# Patient Record
Sex: Male | Born: 1935 | Race: White | Hispanic: No | Marital: Married | Smoking: Never smoker
Health system: Southern US, Community
[De-identification: ages and names within clinical notes are randomized; demographics above are authoritative.]

## PROBLEM LIST (undated history)

## (undated) DIAGNOSIS — F0280 Dementia in other diseases classified elsewhere without behavioral disturbance: Secondary | ICD-10-CM

## (undated) DIAGNOSIS — G309 Alzheimer's disease, unspecified: Secondary | ICD-10-CM

## (undated) DIAGNOSIS — K900 Celiac disease: Secondary | ICD-10-CM

## (undated) DIAGNOSIS — C439 Malignant melanoma of skin, unspecified: Secondary | ICD-10-CM

## (undated) DIAGNOSIS — C801 Malignant (primary) neoplasm, unspecified: Secondary | ICD-10-CM

## (undated) HISTORY — DX: Alzheimer's disease, unspecified: G30.9

## (undated) HISTORY — PX: LEG SURGERY: SHX1003

## (undated) HISTORY — DX: Malignant melanoma of skin, unspecified: C43.9

## (undated) HISTORY — DX: Malignant (primary) neoplasm, unspecified: C80.1

## (undated) HISTORY — DX: Dementia in other diseases classified elsewhere without behavioral disturbance: F02.80

---

## 1998-04-18 ENCOUNTER — Emergency Department (HOSPITAL_COMMUNITY): Admission: EM | Admit: 1998-04-18 | Discharge: 1998-04-18 | Payer: Self-pay | Admitting: Emergency Medicine

## 1998-05-17 ENCOUNTER — Ambulatory Visit (HOSPITAL_COMMUNITY): Admission: RE | Admit: 1998-05-17 | Discharge: 1998-05-17 | Payer: Self-pay | Admitting: Gastroenterology

## 1998-07-26 ENCOUNTER — Ambulatory Visit (HOSPITAL_BASED_OUTPATIENT_CLINIC_OR_DEPARTMENT_OTHER): Admission: RE | Admit: 1998-07-26 | Discharge: 1998-07-26 | Payer: Self-pay | Admitting: Orthopedic Surgery

## 2003-02-18 ENCOUNTER — Emergency Department (HOSPITAL_COMMUNITY): Admission: EM | Admit: 2003-02-18 | Discharge: 2003-02-18 | Payer: Self-pay | Admitting: Emergency Medicine

## 2004-03-01 ENCOUNTER — Ambulatory Visit (HOSPITAL_COMMUNITY): Admission: RE | Admit: 2004-03-01 | Discharge: 2004-03-01 | Payer: Self-pay | Admitting: *Deleted

## 2004-03-01 ENCOUNTER — Encounter (INDEPENDENT_AMBULATORY_CARE_PROVIDER_SITE_OTHER): Payer: Self-pay | Admitting: Specialist

## 2006-11-29 ENCOUNTER — Ambulatory Visit (HOSPITAL_COMMUNITY): Admission: RE | Admit: 2006-11-29 | Discharge: 2006-11-29 | Payer: Self-pay | Admitting: Gastroenterology

## 2006-12-06 ENCOUNTER — Encounter: Admission: RE | Admit: 2006-12-06 | Discharge: 2006-12-06 | Payer: Self-pay | Admitting: Gastroenterology

## 2006-12-17 ENCOUNTER — Encounter: Admission: RE | Admit: 2006-12-17 | Discharge: 2006-12-17 | Payer: Self-pay | Admitting: Gastroenterology

## 2007-01-21 ENCOUNTER — Encounter: Admission: RE | Admit: 2007-01-21 | Discharge: 2007-01-21 | Payer: Self-pay | Admitting: Gastroenterology

## 2007-10-09 ENCOUNTER — Encounter: Admission: RE | Admit: 2007-10-09 | Discharge: 2007-10-09 | Payer: Self-pay | Admitting: General Surgery

## 2008-01-13 DIAGNOSIS — C439 Malignant melanoma of skin, unspecified: Secondary | ICD-10-CM

## 2008-01-13 HISTORY — DX: Malignant melanoma of skin, unspecified: C43.9

## 2008-03-16 HISTORY — PX: MELANOMA EXCISION: SHX5266

## 2008-03-19 ENCOUNTER — Ambulatory Visit (HOSPITAL_COMMUNITY): Admission: RE | Admit: 2008-03-19 | Discharge: 2008-03-19 | Payer: Self-pay | Admitting: General Surgery

## 2008-03-19 ENCOUNTER — Encounter (INDEPENDENT_AMBULATORY_CARE_PROVIDER_SITE_OTHER): Payer: Self-pay | Admitting: General Surgery

## 2008-04-02 ENCOUNTER — Ambulatory Visit: Payer: Self-pay | Admitting: Internal Medicine

## 2008-04-19 LAB — COMPREHENSIVE METABOLIC PANEL
ALT: 12 U/L (ref 0–53)
AST: 12 U/L (ref 0–37)
Chloride: 107 mEq/L (ref 96–112)
Creatinine, Ser: 1.12 mg/dL (ref 0.40–1.50)
Sodium: 143 mEq/L (ref 135–145)
Total Bilirubin: 0.5 mg/dL (ref 0.3–1.2)

## 2008-04-19 LAB — CBC WITH DIFFERENTIAL/PLATELET
BASO%: 0.7 % (ref 0.0–2.0)
EOS%: 3.5 % (ref 0.0–7.0)
HCT: 39.2 % (ref 38.4–49.9)
LYMPH%: 23.2 % (ref 14.0–49.0)
MCH: 29.6 pg (ref 27.2–33.4)
MCHC: 33.5 g/dL (ref 32.0–36.0)
NEUT%: 64.3 % (ref 39.0–75.0)
RBC: 4.45 10*6/uL (ref 4.20–5.82)
lymph#: 1 10*3/uL (ref 0.9–3.3)

## 2008-10-14 ENCOUNTER — Ambulatory Visit: Payer: Self-pay | Admitting: Internal Medicine

## 2008-10-19 LAB — COMPREHENSIVE METABOLIC PANEL
ALT: 9 U/L (ref 0–53)
Albumin: 3.8 g/dL (ref 3.5–5.2)
CO2: 25 mEq/L (ref 19–32)
Chloride: 108 mEq/L (ref 96–112)
Glucose, Bld: 92 mg/dL (ref 70–99)
Potassium: 4.2 mEq/L (ref 3.5–5.3)
Sodium: 144 mEq/L (ref 135–145)
Total Bilirubin: 0.5 mg/dL (ref 0.3–1.2)
Total Protein: 5.9 g/dL — ABNORMAL LOW (ref 6.0–8.3)

## 2008-10-19 LAB — CBC WITH DIFFERENTIAL/PLATELET
Basophils Absolute: 0.1 10*3/uL (ref 0.0–0.1)
Eosinophils Absolute: 0.3 10*3/uL (ref 0.0–0.5)
HGB: 12.9 g/dL — ABNORMAL LOW (ref 13.0–17.1)
MONO#: 0.4 10*3/uL (ref 0.1–0.9)
NEUT#: 3 10*3/uL (ref 1.5–6.5)
RBC: 4.43 10*6/uL (ref 4.20–5.82)
RDW: 13.7 % (ref 11.0–14.6)
WBC: 4.9 10*3/uL (ref 4.0–10.3)
lymph#: 1.2 10*3/uL (ref 0.9–3.3)
nRBC: 0 % (ref 0–0)

## 2008-10-19 LAB — LACTATE DEHYDROGENASE: LDH: 139 U/L (ref 94–250)

## 2009-03-29 ENCOUNTER — Other Ambulatory Visit: Payer: Self-pay | Admitting: Internal Medicine

## 2009-03-29 ENCOUNTER — Ambulatory Visit: Payer: Self-pay | Admitting: Internal Medicine

## 2009-03-29 LAB — COMPREHENSIVE METABOLIC PANEL
AST: 15 U/L (ref 0–37)
Albumin: 4 g/dL (ref 3.5–5.2)
Alkaline Phosphatase: 57 U/L (ref 39–117)
BUN: 19 mg/dL (ref 6–23)
Calcium: 9.1 mg/dL (ref 8.4–10.5)
Chloride: 109 mEq/L (ref 96–112)
Glucose, Bld: 94 mg/dL (ref 70–99)
Potassium: 4.1 mEq/L (ref 3.5–5.3)
Sodium: 144 mEq/L (ref 135–145)
Total Protein: 6.1 g/dL (ref 6.0–8.3)

## 2009-03-29 LAB — CBC WITH DIFFERENTIAL/PLATELET
Basophils Absolute: 0 10*3/uL (ref 0.0–0.1)
Eosinophils Absolute: 0.2 10*3/uL (ref 0.0–0.5)
HGB: 12.8 g/dL — ABNORMAL LOW (ref 13.0–17.1)
MONO%: 8.3 % (ref 0.0–14.0)
NEUT#: 3.3 10*3/uL (ref 1.5–6.5)
RBC: 4.32 10*6/uL (ref 4.20–5.82)
RDW: 14.2 % (ref 11.0–14.6)
WBC: 4.9 10*3/uL (ref 4.0–10.3)
lymph#: 1 10*3/uL (ref 0.9–3.3)

## 2009-04-14 ENCOUNTER — Ambulatory Visit (HOSPITAL_COMMUNITY): Admission: RE | Admit: 2009-04-14 | Discharge: 2009-04-14 | Payer: Self-pay | Admitting: Internal Medicine

## 2009-09-16 ENCOUNTER — Ambulatory Visit: Payer: Self-pay | Admitting: Internal Medicine

## 2009-09-20 LAB — CBC WITH DIFFERENTIAL/PLATELET
Basophils Absolute: 0 10*3/uL (ref 0.0–0.1)
EOS%: 2.9 % (ref 0.0–7.0)
Eosinophils Absolute: 0.1 10*3/uL (ref 0.0–0.5)
HCT: 35.8 % — ABNORMAL LOW (ref 38.4–49.9)
HGB: 12.4 g/dL — ABNORMAL LOW (ref 13.0–17.1)
MCH: 30.3 pg (ref 27.2–33.4)
NEUT#: 3.3 10*3/uL (ref 1.5–6.5)
NEUT%: 66 % (ref 39.0–75.0)
lymph#: 1.2 10*3/uL (ref 0.9–3.3)

## 2009-09-20 LAB — COMPREHENSIVE METABOLIC PANEL
AST: 13 U/L (ref 0–37)
Albumin: 4.3 g/dL (ref 3.5–5.2)
BUN: 16 mg/dL (ref 6–23)
CO2: 23 mEq/L (ref 19–32)
Calcium: 9 mg/dL (ref 8.4–10.5)
Chloride: 109 mEq/L (ref 96–112)
Creatinine, Ser: 1.17 mg/dL (ref 0.40–1.50)
Glucose, Bld: 124 mg/dL — ABNORMAL HIGH (ref 70–99)
Potassium: 3.7 mEq/L (ref 3.5–5.3)

## 2009-09-20 LAB — LACTATE DEHYDROGENASE: LDH: 153 U/L (ref 94–250)

## 2010-03-24 ENCOUNTER — Ambulatory Visit (HOSPITAL_COMMUNITY)
Admission: RE | Admit: 2010-03-24 | Discharge: 2010-03-24 | Disposition: A | Discharge status: Home / Self Care | Payer: MEDICARE | Source: Ambulatory Visit | Attending: Internal Medicine | Admitting: Internal Medicine

## 2010-03-24 ENCOUNTER — Other Ambulatory Visit: Payer: Self-pay | Admitting: Internal Medicine

## 2010-03-24 ENCOUNTER — Encounter (HOSPITAL_BASED_OUTPATIENT_CLINIC_OR_DEPARTMENT_OTHER): Payer: MEDICARE | Admitting: Internal Medicine

## 2010-03-24 DIAGNOSIS — C4360 Malignant melanoma of unspecified upper limb, including shoulder: Secondary | ICD-10-CM

## 2010-03-24 DIAGNOSIS — C439 Malignant melanoma of skin, unspecified: Secondary | ICD-10-CM | POA: Insufficient documentation

## 2010-03-24 DIAGNOSIS — M47814 Spondylosis without myelopathy or radiculopathy, thoracic region: Secondary | ICD-10-CM | POA: Insufficient documentation

## 2010-03-24 DIAGNOSIS — R51 Headache: Secondary | ICD-10-CM

## 2010-03-24 LAB — COMPREHENSIVE METABOLIC PANEL
ALT: 15 U/L (ref 0–53)
AST: 12 U/L (ref 0–37)
BUN: 15 mg/dL (ref 6–23)
CO2: 30 mEq/L (ref 19–32)
Creatinine, Ser: 1.17 mg/dL (ref 0.40–1.50)
Total Bilirubin: 0.5 mg/dL (ref 0.3–1.2)

## 2010-03-24 LAB — CBC WITH DIFFERENTIAL/PLATELET
BASO%: 1 % (ref 0.0–2.0)
EOS%: 5.1 % (ref 0.0–7.0)
HCT: 40.3 % (ref 38.4–49.9)
LYMPH%: 21.7 % (ref 14.0–49.0)
MCH: 29.4 pg (ref 27.2–33.4)
MCHC: 33.7 g/dL (ref 32.0–36.0)
NEUT%: 64.3 % (ref 39.0–75.0)
Platelets: 242 10*3/uL (ref 140–400)

## 2010-03-24 LAB — LACTATE DEHYDROGENASE: LDH: 137 U/L (ref 94–250)

## 2010-03-28 ENCOUNTER — Encounter (HOSPITAL_BASED_OUTPATIENT_CLINIC_OR_DEPARTMENT_OTHER): Payer: MEDICARE | Admitting: Internal Medicine

## 2010-03-28 DIAGNOSIS — C4360 Malignant melanoma of unspecified upper limb, including shoulder: Secondary | ICD-10-CM

## 2010-03-28 DIAGNOSIS — R51 Headache: Secondary | ICD-10-CM

## 2010-04-13 DIAGNOSIS — F0280 Dementia in other diseases classified elsewhere without behavioral disturbance: Secondary | ICD-10-CM | POA: Insufficient documentation

## 2010-04-13 HISTORY — DX: Dementia in other diseases classified elsewhere, unspecified severity, without behavioral disturbance, psychotic disturbance, mood disturbance, and anxiety: F02.80

## 2010-05-30 LAB — BASIC METABOLIC PANEL
CO2: 29 mEq/L (ref 19–32)
Calcium: 9.5 mg/dL (ref 8.4–10.5)
Creatinine, Ser: 1.07 mg/dL (ref 0.4–1.5)
GFR calc Af Amer: >60 mL/min (ref >60)
GFR calc non Af Amer: >60 mL/min (ref >60)
Glucose, Bld: 94 mg/dL (ref 70–99)
Sodium: 142 mEq/L (ref 135–145)

## 2010-05-30 LAB — CBC
MCHC: 32.7 g/dL (ref 30.0–36.0)
RDW: 14.1 % (ref 11.5–15.5)

## 2010-06-27 NOTE — Op Note (Signed)
NAME:  PELLEGRINO, KENNARD              ACCOUNT NO.:  [id number]   MEDICAL RECORD NO.:  [id number]          PATIENT TYPE:  AMB   LOCATION:  ENDO                         FACILITY:  [facility]   PHYSICIAN:  Shirley Friar, MDDATE OF BIRTH:  12-13-1924   DATE OF PROCEDURE:  DATE OF DISCHARGE:  11/29/2006                               OPERATIVE REPORT   PROCEDURE:  Capsule endoscopy.   ENDOSCOPIST:  Shirley Friar, MD   INDICATIONS:  Anemia, heme-positive stool, recently diagnosed celiac  disease.   PROCEDURE:  Capsule reach the stomach at 1 minute 40 seconds.  The  duodenum was reached at 37 minute 33 seconds.  The last captured image  at the end of 8 hours was still in the small intestine.   FINDINGS:  1. Scalloped mucosa in proximal small bowel consistent with celiac      disease.  2. Study limited by last image being small bowel and not colon (entire      small bowel not seen on capsule endoscopy).   RECOMMENDATION:  1. Consider small bowel enterography to look at remaining portion of      small intestine.  2. Continue gluten-free diet.  3. Check KUB to evaluate whether capsule has passed.      Shirley Friar, MD  Electronically Signed     VCS/MEDQ  D:  12/04/2006  T:  12/05/2006  Job:  [id number]   cc:   Vikki Ports, M.D.  Fax: [phone]

## 2010-06-27 NOTE — Op Note (Signed)
NAME:  Kyle York, Kyle York              ACCOUNT NO.:  [id number]   MEDICAL RECORD NO.:  [id number]          PATIENT TYPE:  AMB   LOCATION:  SDS                          FACILITY:  [facility]   PHYSICIAN:  Gabrielle Dare. Janee Morn, M.D.DATE OF BIRTH:  [birth year]   DATE OF PROCEDURE:  03/19/2008  DATE OF DISCHARGE:  03/19/2008                               OPERATIVE REPORT   PREOPERATIVE DIAGNOSIS:  Melanoma, left posterior shoulder, 0.25 mm in  thickness (T1a tumor).   POSTOPERATIVE DIAGNOSIS:  Melanoma, left posterior shoulder, 0.25 mm in  thickness (T1a tumor).   PROCEDURE:  Wide excision of melanoma, left posterior shoulder, 3 x 6 cm  with layered closure.   SURGEON:  Gabrielle Dare. Janee Morn, MD   ANESTHESIA:  General with laryngeal mask airway.   HISTORY OF PRESENT ILLNESS:  Mr. Kyle York is an 77 year old gentleman who  underwent a biopsy of his left posterior shoulder by Dr. Dorinda Hill.  The pathology report demonstrated malignant melanoma.  This is  a superficial spreading tumor, 0.25 mm in thickness.  Margin was  involved only by an in situ component.  We are planning wide excision  today.   PROCEDURE IN DETAIL:  Informed consent was obtained.  The patient was  identified, his site was marked.  He received intravenous antibiotics.  He was brought to the operating room.  General anesthesia with laryngeal  mask airway was administered.  He was placed in right lateral position.  His left posterior shoulder was prepped and draped in sterile fashion.  Marcaine 0.25% with epinephrine was injected for local anesthetic.  An  elliptical incision was then measured out primarily getting over 1 cm  margin circumferentially around the lesion, this in order to facilitate  closure.  The ellipse was 3 x 6 cm.  An incision was made.  Subcutaneous  tissues were dissected down to the underlying fascia and the ellipse of  tissue was excised completely.  The specimen was oriented for pathology  and passed  off.  I then changed my gloves and did not reuse any the  initial instruments.  The wound was copiously irrigated.  Meticulous  hemostasis was obtained.  Some flaps were raised medially and laterally.  Again, we achieved hemostasis and the wound was then closed with  subcutaneous tissues approximated with interrupted 2-0 Vicryl sutures  and the skin closed with interrupted 3-0 nylon sutures.  Sponge, needle,  and instrument counts were correct.  There was no bleeding and an  antibiotic ointment and sterile dressing were applied.  The patient  tolerated the procedure well without apparent complication and was taken  recovery room in stable condition.      Gabrielle Dare Janee Morn, M.D.  Electronically Signed     BET/MEDQ  D:  03/19/2008  T:  03/20/2008  Job:  [id number]   cc:   Dollene Cleveland, M.D.

## 2010-11-11 ENCOUNTER — Emergency Department (HOSPITAL_COMMUNITY)
Admission: EM | Admit: 2010-11-11 | Discharge: 2010-11-12 | Disposition: A | Discharge status: Home / Self Care | Payer: Medicare Other | Attending: Emergency Medicine | Admitting: Emergency Medicine

## 2010-11-11 ENCOUNTER — Emergency Department (HOSPITAL_COMMUNITY): Payer: Medicare Other

## 2010-11-11 DIAGNOSIS — G309 Alzheimer's disease, unspecified: Secondary | ICD-10-CM | POA: Insufficient documentation

## 2010-11-11 DIAGNOSIS — R634 Abnormal weight loss: Secondary | ICD-10-CM | POA: Insufficient documentation

## 2010-11-11 DIAGNOSIS — R110 Nausea: Secondary | ICD-10-CM | POA: Insufficient documentation

## 2010-11-11 DIAGNOSIS — Z79899 Other long term (current) drug therapy: Secondary | ICD-10-CM | POA: Insufficient documentation

## 2010-11-11 DIAGNOSIS — R5381 Other malaise: Secondary | ICD-10-CM | POA: Insufficient documentation

## 2010-11-11 DIAGNOSIS — F0280 Dementia in other diseases classified elsewhere without behavioral disturbance: Secondary | ICD-10-CM | POA: Insufficient documentation

## 2010-11-11 DIAGNOSIS — R0989 Other specified symptoms and signs involving the circulatory and respiratory systems: Secondary | ICD-10-CM | POA: Insufficient documentation

## 2010-11-12 ENCOUNTER — Emergency Department (HOSPITAL_COMMUNITY): Payer: Medicare Other

## 2010-11-12 LAB — COMPREHENSIVE METABOLIC PANEL
ALT: 11 U/L (ref 0–53)
AST: 13 U/L (ref 0–37)
Albumin: 4.2 g/dL (ref 3.5–5.2)
CO2: 30 mEq/L (ref 19–32)
Chloride: 103 mEq/L (ref 96–112)
Creatinine, Ser: 0.96 mg/dL (ref 0.50–1.35)
GFR calc non Af Amer: >60 mL/min (ref >60)
Potassium: 4 mEq/L (ref 3.5–5.1)
Sodium: 140 mEq/L (ref 135–145)
Total Bilirubin: 0.8 mg/dL (ref 0.3–1.2)

## 2010-11-12 LAB — DIFFERENTIAL
Eosinophils Relative: 1 % (ref 0–5)
Lymphocytes Relative: 14 % (ref 12–46)
Lymphs Abs: 1 10*3/uL (ref 0.7–4.0)
Monocytes Absolute: 0.5 10*3/uL (ref 0.1–1.0)

## 2010-11-12 LAB — POCT I-STAT TROPONIN I

## 2010-11-12 LAB — POCT I-STAT, CHEM 8
BUN: 18 mg/dL (ref 6–23)
Chloride: 105 mEq/L (ref 96–112)
Creatinine, Ser: 1.1 mg/dL (ref 0.50–1.35)
Glucose, Bld: 118 mg/dL — ABNORMAL HIGH (ref 70–99)
Potassium: 4.2 mEq/L (ref 3.5–5.1)
Sodium: 142 mEq/L (ref 135–145)

## 2010-11-12 LAB — CBC
HCT: 40.8 % (ref 39.0–52.0)
Hemoglobin: 13.5 g/dL (ref 13.0–17.0)
MCHC: 33.1 g/dL (ref 30.0–36.0)
RBC: 4.55 MIL/uL (ref 4.22–5.81)
WBC: 7.4 10*3/uL (ref 4.0–10.5)

## 2010-11-12 LAB — URINALYSIS, ROUTINE W REFLEX MICROSCOPIC
Bilirubin Urine: NEGATIVE
Glucose, UA: NEGATIVE mg/dL
Hgb urine dipstick: NEGATIVE
Protein, ur: NEGATIVE mg/dL
Urobilinogen, UA: 0.2 mg/dL (ref 0.0–1.0)

## 2010-11-12 LAB — GLUCOSE, CAPILLARY: Glucose-Capillary: 104 mg/dL — ABNORMAL HIGH (ref 70–99)

## 2010-11-13 LAB — URINE CULTURE
Colony Count: NO GROWTH
Culture: NO GROWTH

## 2011-03-03 ENCOUNTER — Telehealth: Payer: Self-pay | Admitting: Internal Medicine

## 2011-03-03 NOTE — Telephone Encounter (Signed)
spoke with pt regarding 2/12 appt,aware of 11;45 time,(conversion issue)  aom

## 2011-03-22 ENCOUNTER — Other Ambulatory Visit: Payer: Self-pay | Admitting: Internal Medicine

## 2011-03-22 ENCOUNTER — Other Ambulatory Visit (HOSPITAL_BASED_OUTPATIENT_CLINIC_OR_DEPARTMENT_OTHER): Payer: Medicare Other | Admitting: Lab

## 2011-03-22 ENCOUNTER — Ambulatory Visit (HOSPITAL_COMMUNITY)
Admission: RE | Admit: 2011-03-22 | Discharge: 2011-03-22 | Disposition: A | Discharge status: Home / Self Care | Payer: Medicare Other | Source: Ambulatory Visit | Attending: Internal Medicine | Admitting: Internal Medicine

## 2011-03-22 DIAGNOSIS — Z85820 Personal history of malignant melanoma of skin: Secondary | ICD-10-CM

## 2011-03-22 DIAGNOSIS — C4360 Malignant melanoma of unspecified upper limb, including shoulder: Secondary | ICD-10-CM

## 2011-03-22 DIAGNOSIS — R51 Headache: Secondary | ICD-10-CM

## 2011-03-22 LAB — COMPREHENSIVE METABOLIC PANEL
ALT: 12 U/L (ref 0–53)
AST: 13 U/L (ref 0–37)
Albumin: 4.2 g/dL (ref 3.5–5.2)
Alkaline Phosphatase: 58 U/L (ref 39–117)
Calcium: 9.2 mg/dL (ref 8.4–10.5)
Chloride: 108 mEq/L (ref 96–112)
Potassium: 4.5 mEq/L (ref 3.5–5.3)
Sodium: 142 mEq/L (ref 135–145)
Total Protein: 6.3 g/dL (ref 6.0–8.3)

## 2011-03-22 LAB — CBC WITH DIFFERENTIAL/PLATELET
BASO%: 0.7 % (ref 0.0–2.0)
EOS%: 4.2 % (ref 0.0–7.0)
HGB: 13.2 g/dL (ref 13.0–17.1)
MCH: 29.7 pg (ref 27.2–33.4)
MCV: 88.8 fL (ref 79.3–98.0)
MONO%: 7.8 % (ref 0.0–14.0)
NEUT#: 3.1 10*3/uL (ref 1.5–6.5)
RBC: 4.45 10*6/uL (ref 4.20–5.82)
RDW: 14.3 % (ref 11.0–14.6)
lymph#: 1 10*3/uL (ref 0.9–3.3)

## 2011-03-27 ENCOUNTER — Ambulatory Visit (HOSPITAL_BASED_OUTPATIENT_CLINIC_OR_DEPARTMENT_OTHER): Payer: Medicare Other | Admitting: Internal Medicine

## 2011-03-27 ENCOUNTER — Encounter: Payer: Self-pay | Admitting: Internal Medicine

## 2011-03-27 ENCOUNTER — Telehealth: Payer: Self-pay | Admitting: Internal Medicine

## 2011-03-27 VITALS — BP 155/70 | HR 52 | Temp 97.1°F | Ht 64.0 in | Wt 141.3 lb

## 2011-03-27 DIAGNOSIS — C439 Malignant melanoma of skin, unspecified: Secondary | ICD-10-CM

## 2011-03-27 NOTE — Telephone Encounter (Signed)
Gv pt appt for feb2014.  gv pt copy of cxr orders to have done in feb2014

## 2011-03-27 NOTE — Progress Notes (Signed)
[facility] OFFICE PROGRESS NOTE  PRINCIPAL DIAGNOSIS:  Stage IA ([id number]) malignant melanoma with Breslow thickness of 0.2 mm diagnosed in December 2009.  PRIOR THERAPY:  Status post wide excision of melanoma from the left posterior shoulder under the care of Dr Janee Morn on March 16, 2008.  CURRENT THERAPY:  Observation.  INTERVAL HISTORY: Kyle York 78 y.o. male returns to the clinic today for annual followup visit accompanied his wife. The patient was recently diagnosed with Lyme he started treatment with Namenda and Razadyne. He denied having any significant complaints today. He was last seen by his dermatologist in September of 2012. There was no suspicious lesion for melanoma. He has repeat CBC, comprehensive metabolic panel, LDH and chest x-ray performed recently and he is here today for evaluation and discussion of his lab and imaging results.  MEDICAL HISTORY: Past Medical History  Diagnosis Date  . Alzheimer disease march 2012    ALLERGIES:   has no known allergies.  MEDICATIONS:  Current Outpatient Prescriptions  Medication Sig Dispense Refill  . aspirin 81 MG tablet Take 81 mg by mouth daily.      . calcium carbonate (OS-CAL) 600 MG TABS Take 600 mg by mouth daily.      . Cyanocobalamin (VITAMIN B 12 PO) Take by mouth daily.      Marland Kitchen galantamine (RAZADYNE ER) 24 MG 24 hr capsule Take 24 mg by mouth daily with breakfast.      . memantine (NAMENDA) 10 MG tablet Take 10 mg by mouth 2 (two) times daily.        REVIEW OF SYSTEMS:  A comprehensive review of systems was negative.   PHYSICAL EXAMINATION: General appearance: alert, cooperative and no distress Neck: no adenopathy Lymph nodes: Cervical, supraclavicular, and axillary nodes normal. Resp: clear to auscultation bilaterally Cardio: regular rate and rhythm, S1, S2 normal, no murmur, click, rub or gallop GI: soft, non-tender; bowel sounds normal; no masses,  no  organomegaly Extremities: extremities normal, atraumatic, no cyanosis or edema Neurologic: Alert and oriented X 3, normal strength and tone. Normal symmetric reflexes. Normal coordination and gait Skin exam: Showed no suspicious lesion for melanoma. ECOG PERFORMANCE STATUS: 0 - Asymptomatic  Blood pressure 155/70, pulse 52, temperature 97.1 F (36.2 C), temperature source Oral, height 5\' 4"  (1.626 m), weight 141 lb 4.8 oz (64.093 kg).  LABORATORY DATA: Lab Results  Component Value Date   WBC 4.6 03/22/2011   HGB 13.2 03/22/2011   HCT 39.5 03/22/2011   MCV 88.8 03/22/2011   PLT 236 03/22/2011      Chemistry      Component Value Date/Time   NA 142 03/22/2011 0903   K 4.5 03/22/2011 0903   CL 108 03/22/2011 0903   CO2 27 03/22/2011 0903   BUN 23 03/22/2011 0903   CREATININE 1.19 03/22/2011 0903      Component Value Date/Time   CALCIUM 9.2 03/22/2011 0903   ALKPHOS 58 03/22/2011 0903   AST 13 03/22/2011 0903   ALT 12 03/22/2011 0903   BILITOT 0.5 03/22/2011 0903       RADIOGRAPHIC STUDIES: Dg Chest 2 View  03/22/2011  *RADIOLOGY REPORT*  Clinical Data: History of melanoma.  CHEST - 2 VIEW  Comparison: Plain films of the chest 11/12/2010, 04/14/2009 and 03/16/2008.  Findings: The lungs are clear.  Heart size is normal.  No pneumothorax or pleural effusion.  No focal bony abnormality with degenerative change of the thoracic spine noted.  IMPRESSION: Negative for metastatic or acute disease.  Original Report Authenticated By: Bernadene Bell. D'ALESSIO, M.D.    ASSESSMENT: This is a very pleasant 78 years old white male with history of stage IA malignant melanoma status post wide excision. He is on observation since February of 2000 and was no evidence for melanoma recurrence.  PLAN: I discussed the lab and chest x-ray results with the patient and his wife. I recommended for him continuous observation for now with repeat CBC, comprehensive metabolic panel, LDH and chest x-ray in one year. He would come back for  followup visit at that time. He was advised to keep his appointment and followup with his dermatologist.   All questions were answered. The patient knows to call the clinic with any problems, questions or concerns. We can certainly see the patient much sooner if necessary.

## 2011-05-14 ENCOUNTER — Other Ambulatory Visit: Payer: Self-pay | Admitting: Dermatology

## 2011-05-14 DIAGNOSIS — C801 Malignant (primary) neoplasm, unspecified: Secondary | ICD-10-CM

## 2011-05-14 HISTORY — PX: OTHER SURGICAL HISTORY: SHX169

## 2011-05-14 HISTORY — DX: Malignant (primary) neoplasm, unspecified: C80.1

## 2011-08-28 ENCOUNTER — Encounter: Payer: Self-pay | Admitting: *Deleted

## 2011-08-29 ENCOUNTER — Encounter: Payer: Self-pay | Admitting: Radiation Oncology

## 2011-09-03 ENCOUNTER — Encounter: Payer: Self-pay | Admitting: *Deleted

## 2011-09-03 ENCOUNTER — Ambulatory Visit: Payer: Medicare Other | Attending: Radiation Oncology | Admitting: Radiation Oncology

## 2011-09-03 ENCOUNTER — Ambulatory Visit: Payer: Medicare Other

## 2012-03-25 ENCOUNTER — Ambulatory Visit (HOSPITAL_COMMUNITY)
Admission: RE | Admit: 2012-03-25 | Discharge: 2012-03-25 | Disposition: A | Discharge status: Home / Self Care | Payer: Medicare Other | Source: Ambulatory Visit | Attending: Internal Medicine | Admitting: Internal Medicine

## 2012-03-25 ENCOUNTER — Other Ambulatory Visit: Payer: Medicare Other | Admitting: Lab

## 2012-03-25 ENCOUNTER — Other Ambulatory Visit (HOSPITAL_BASED_OUTPATIENT_CLINIC_OR_DEPARTMENT_OTHER): Payer: Medicare Other | Admitting: Lab

## 2012-03-25 DIAGNOSIS — C439 Malignant melanoma of skin, unspecified: Secondary | ICD-10-CM

## 2012-03-25 DIAGNOSIS — C4360 Malignant melanoma of unspecified upper limb, including shoulder: Secondary | ICD-10-CM

## 2012-03-25 DIAGNOSIS — I700 Atherosclerosis of aorta: Secondary | ICD-10-CM | POA: Insufficient documentation

## 2012-03-25 LAB — CBC WITH DIFFERENTIAL/PLATELET
EOS%: 6.1 % (ref 0.0–7.0)
Eosinophils Absolute: 0.3 10*3/uL (ref 0.0–0.5)
LYMPH%: 25.6 % (ref 14.0–49.0)
MCH: 29.2 pg (ref 27.2–33.4)
MCHC: 32 g/dL (ref 32.0–36.0)
MCV: 91.2 fL (ref 79.3–98.0)
MONO%: 8.3 % (ref 0.0–14.0)
NEUT#: 3 10*3/uL (ref 1.5–6.5)
Platelets: 263 10*3/uL (ref 140–400)
RBC: 4.21 10*6/uL (ref 4.20–5.82)

## 2012-03-25 LAB — COMPREHENSIVE METABOLIC PANEL (CC13)
ALT: 13 U/L (ref 0–55)
Alkaline Phosphatase: 62 U/L (ref 40–150)
CO2: 29 mEq/L (ref 22–29)
Creatinine: 1.4 mg/dL — ABNORMAL HIGH (ref 0.7–1.3)
Total Bilirubin: 0.48 mg/dL (ref 0.20–1.20)

## 2012-03-25 LAB — LACTATE DEHYDROGENASE (CC13): LDH: 185 U/L (ref 125–245)

## 2012-03-27 ENCOUNTER — Telehealth: Payer: Self-pay | Admitting: *Deleted

## 2012-03-27 ENCOUNTER — Other Ambulatory Visit: Payer: Self-pay | Admitting: *Deleted

## 2012-03-27 ENCOUNTER — Ambulatory Visit: Payer: Medicare Other | Admitting: Internal Medicine

## 2012-03-27 NOTE — Telephone Encounter (Signed)
Called patient, states they are not able to make appointment with Dr Arbutus Ped today, Please reschedule.

## 2012-03-28 ENCOUNTER — Telehealth: Payer: Self-pay | Admitting: Internal Medicine

## 2012-04-09 ENCOUNTER — Ambulatory Visit (HOSPITAL_BASED_OUTPATIENT_CLINIC_OR_DEPARTMENT_OTHER): Payer: Medicare Other | Admitting: Internal Medicine

## 2012-04-09 ENCOUNTER — Encounter: Payer: Self-pay | Admitting: Internal Medicine

## 2012-04-09 VITALS — BP 158/55 | HR 58 | Temp 97.4°F | Resp 18 | Ht 64.0 in | Wt 140.9 lb

## 2012-04-09 NOTE — Progress Notes (Signed)
[facility] [phone]   [phone]  OFFICE PROGRESS NOTE  Sid Falcon, MD [address] [facility] [place] [address]  PRINCIPAL DIAGNOSIS: Stage IA [facility]) malignant melanoma with Breslow thickness of 0.2 mm diagnosed in December 2009.   PRIOR THERAPY: Status post wide excision of melanoma from the left posterior shoulder under the care of Dr Janee Morn on March 16, 2008.   CURRENT THERAPY: Observation.  INTERVAL HISTORY: Kyle York 79 y.o. male returns to the clinic today for routine annual followup visit accompanied his wife. The patient is feeling fine today with no specific complaints. He denied having any significant weight loss or night sweats. He has no chest pain, shortness of breath, cough or hemoptysis. He has no significant skin abnormalities and he was seen recently by his dermatologist Dr. Donzetta Starch. He has repeat CBC, comprehensive metabolic panel as well as chest x-ray performed recently and he is here for evaluation and discussion of his lab and imaging results.  MEDICAL HISTORY: Past Medical History  Diagnosis Date  . Alzheimer disease march 2012  . Melanoma 01/2008    left shoulder/  . 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  MEDICATIONS:  Current Outpatient Prescriptions  Medication Sig Dispense Refill  . aspirin 81 MG tablet Take 81 mg by mouth daily.      . calcium carbonate (OS-CAL) 600 MG TABS Take 600 mg by mouth daily.      . Cyanocobalamin (VITAMIN B 12 PO) Take by mouth daily.      Marland Kitchen galantamine (RAZADYNE ER) 24 MG 24 hr capsule Take 24 mg by mouth daily with breakfast.      . lisinopril (PRINIVIL,ZESTRIL) 10 MG tablet Take 10 mg by mouth daily.      . memantine (NAMENDA) 10 MG tablet Take by mouth daily. 28 mg daily      . Multiple Vitamin (MULTIVITAMIN) tablet Take 1 tablet by mouth daily. With iron       No current  facility-administered medications for this visit.    SURGICAL HISTORY:  Past Surgical History  Procedure Laterality Date  . Melanoma excision  03/16/2008    left posterior shoulder  . 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   PHYSICAL EXAMINATION: General appearance: alert, cooperative and no distress Head: Normocephalic, without obvious abnormality, atraumatic Neck: no adenopathy Lymph nodes: Cervical, supraclavicular, and axillary nodes normal. Resp: clear to auscultation bilaterally Cardio: regular rate and rhythm, S1, S2 normal, no murmur, click, rub or gallop GI: soft, non-tender; bowel sounds normal; no masses,  no organomegaly Extremities: extremities normal, atraumatic, no cyanosis or edema  ECOG PERFORMANCE STATUS: 0 - Asymptomatic  Blood pressure 158/55, pulse 58, temperature 97.4 F (36.3 C), temperature source Oral, resp. rate 18, height 5\' 4"  (1.626 m), weight 140 lb 14.4 oz (63.912 kg).  LABORATORY DATA: Lab Results  Component Value Date   WBC 5.1 03/25/2012   HGB 12.3* 03/25/2012   HCT 38.4 03/25/2012   MCV 91.2 03/25/2012   PLT 263 03/25/2012      Chemistry      Component Value Date/Time   NA 143 03/25/2012 0814   NA 142 03/22/2011 0903   K 4.3 03/25/2012 0814   K 4.5 03/22/2011 0903   CL  108* 03/25/2012 0814   CL 108 03/22/2011 0903   CO2 29 03/25/2012 0814   CO2 27 03/22/2011 0903   BUN 19.4 03/25/2012 0814   BUN 23 03/22/2011 0903   CREATININE 1.4* 03/25/2012 0814   CREATININE 1.19 03/22/2011 0903      Component Value Date/Time   CALCIUM 9.1 03/25/2012 0814   CALCIUM 9.2 03/22/2011 0903   ALKPHOS 62 03/25/2012 0814   ALKPHOS 58 03/22/2011 0903   AST 13 03/25/2012 0814   AST 13 03/22/2011 0903   ALT 13 03/25/2012 0814   ALT 12 03/22/2011 0903   BILITOT 0.48 03/25/2012 0814   BILITOT 0.5 03/22/2011 0903       RADIOGRAPHIC STUDIES: Dg Chest 1 View  03/25/2012   *RADIOLOGY REPORT*  Clinical Data: Staging.  Melanoma.  CHEST - 1 VIEW  Comparison: Chest radiograph 03/22/2011  Findings: Heart, mediastinal, and hilar contours are stable and within normal limits.  Mild atherosclerotic calcification of the transverse aortic arch.  The trachea is midline.  The lungs are well expanded and clear.  No airspace disease, evidence of mass, or lymphadenopathy.  Negative for pleural effusion or pneumothorax. Probable remote healed fracture of the right ninth rib.  No destructive osseous lesion.  IMPRESSION: Stable chest radiograph.  No evidence of acute cardiopulmonary disease or metastatic disease.   Original Report Authenticated By: Britta Mccreedy, M.D.    Dg Abd 1 View  03/25/2012  *RADIOLOGY REPORT*  Clinical Data: Staging.  History of melanoma.  ABDOMEN - 1 VIEW  Comparison: CT abdomen pelvis 12/17/2006  Findings: Bony mineralization appears decreased. Slight irregularity of the lateral arc of the right ninth rib is noted. The bowel gas pattern is nonobstructive.  Moderate amount of stool in the visualized colon.  No abdominal mass effect.  IMPRESSION:  1.  Moderate amount of stool the colon.  Bowel loops normal in caliber. 2.  Question a remote fracture of the lateral arc of the right ninth rib. No definite acute bony abnormality identified.   Original Report Authenticated By: Britta Mccreedy, M.D.     ASSESSMENT: This is a very pleasant 79 years old white male white male with history of stage IA malignant melanoma status post wide excision and has been observation since February of 2010 with no evidence for disease recurrence.  PLAN: I discussed the lab and imaging results with the patient and his wife. I recommended for him to continue on observation with his primary care physician and dermatologist at this point. I be happy to see the patient in the future if she has any concerning symptoms.  All questions were answered. The patient knows to call the clinic with any problems, questions  or concerns. We can certainly see the patient much sooner if necessary.

## 2012-04-09 NOTE — Patient Instructions (Signed)
No evidence for disease recurrence. Followup with your primary care physician and dermatologist.

## 2012-10-12 IMAGING — CR DG CHEST 2V
2 series · 2 of 2 positions shown · non-contrast
Comparison: 04/14/2009

CLINICAL DATA: Nonsmoker.  History melanoma.

CHEST - 2 VIEW

[w chest pa *]
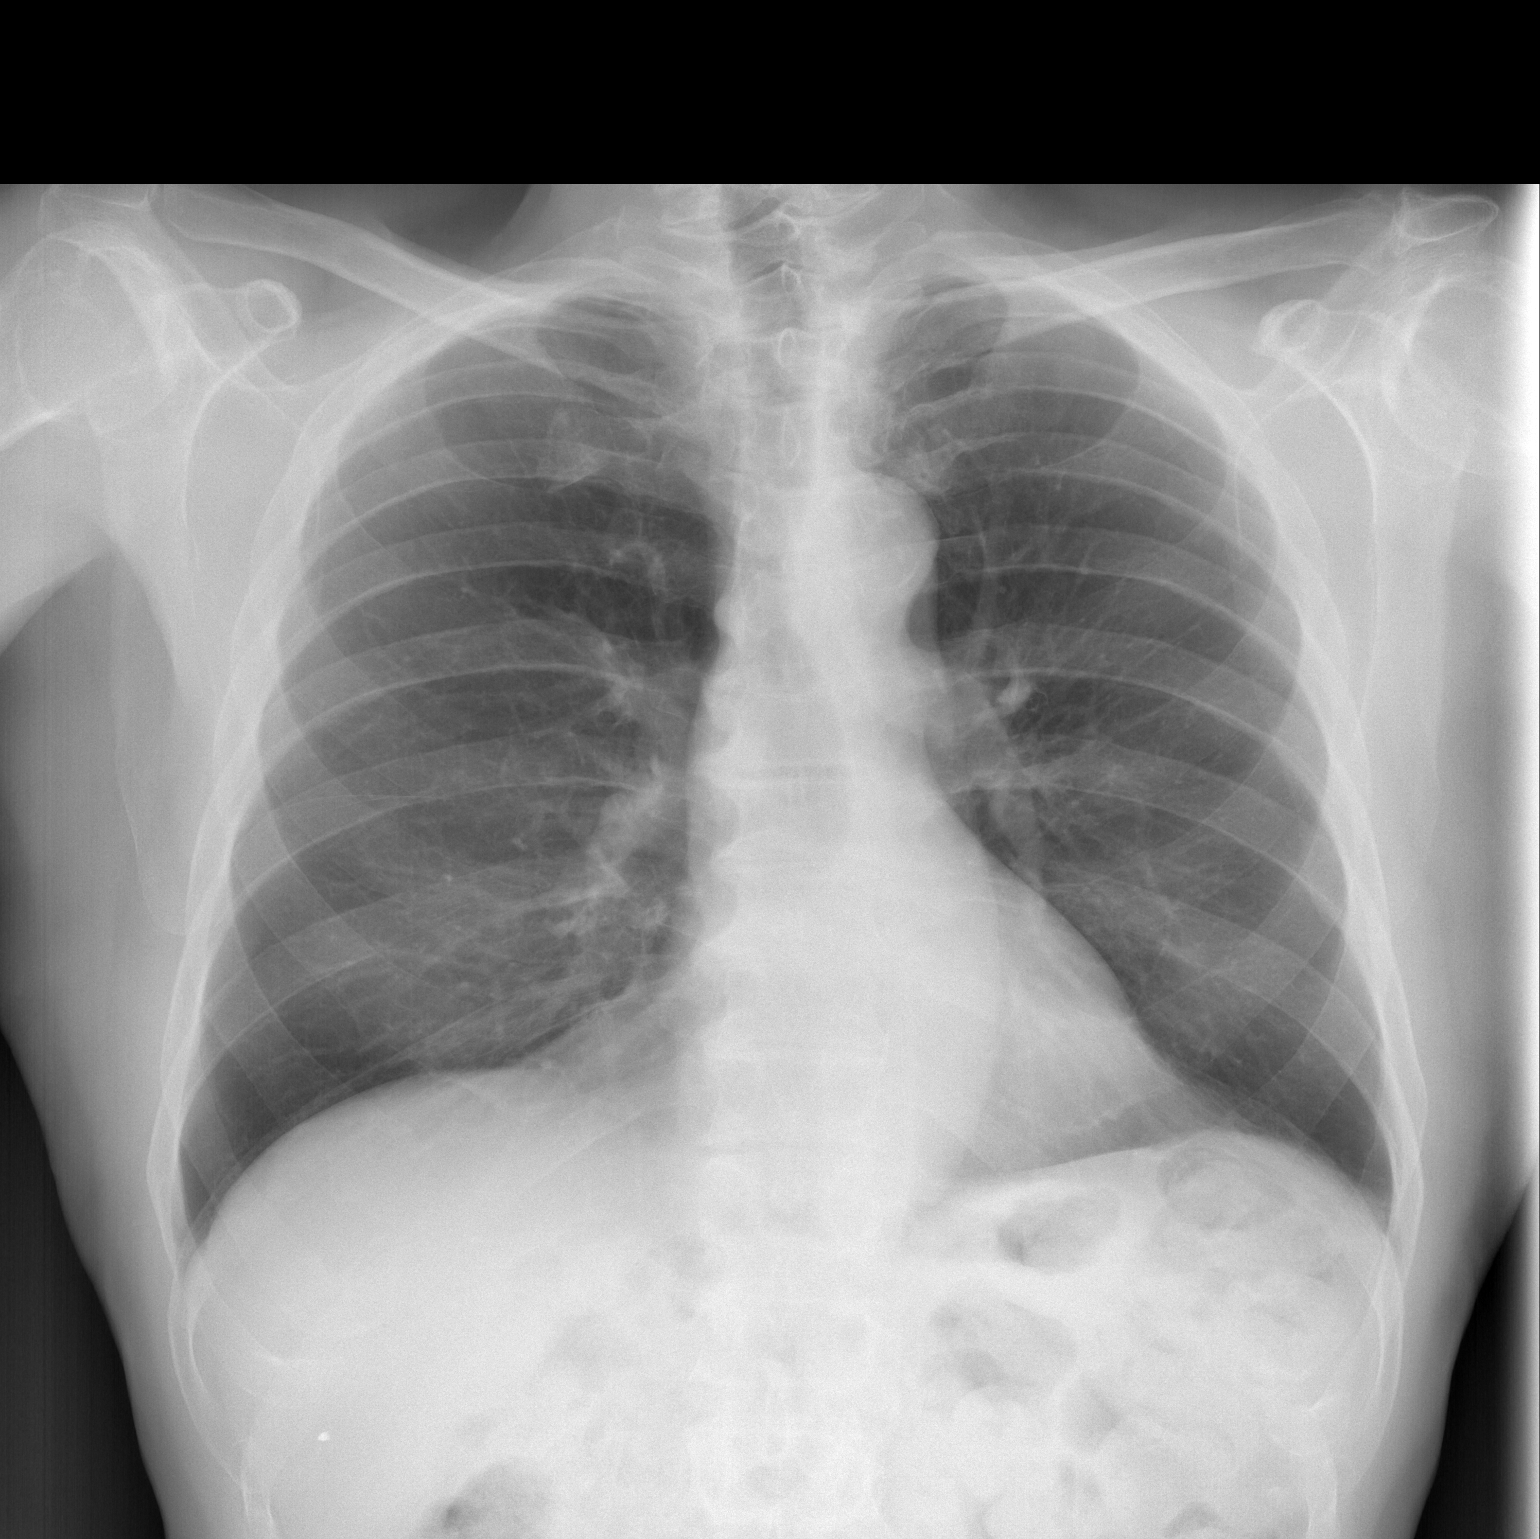

[w chest lat *]
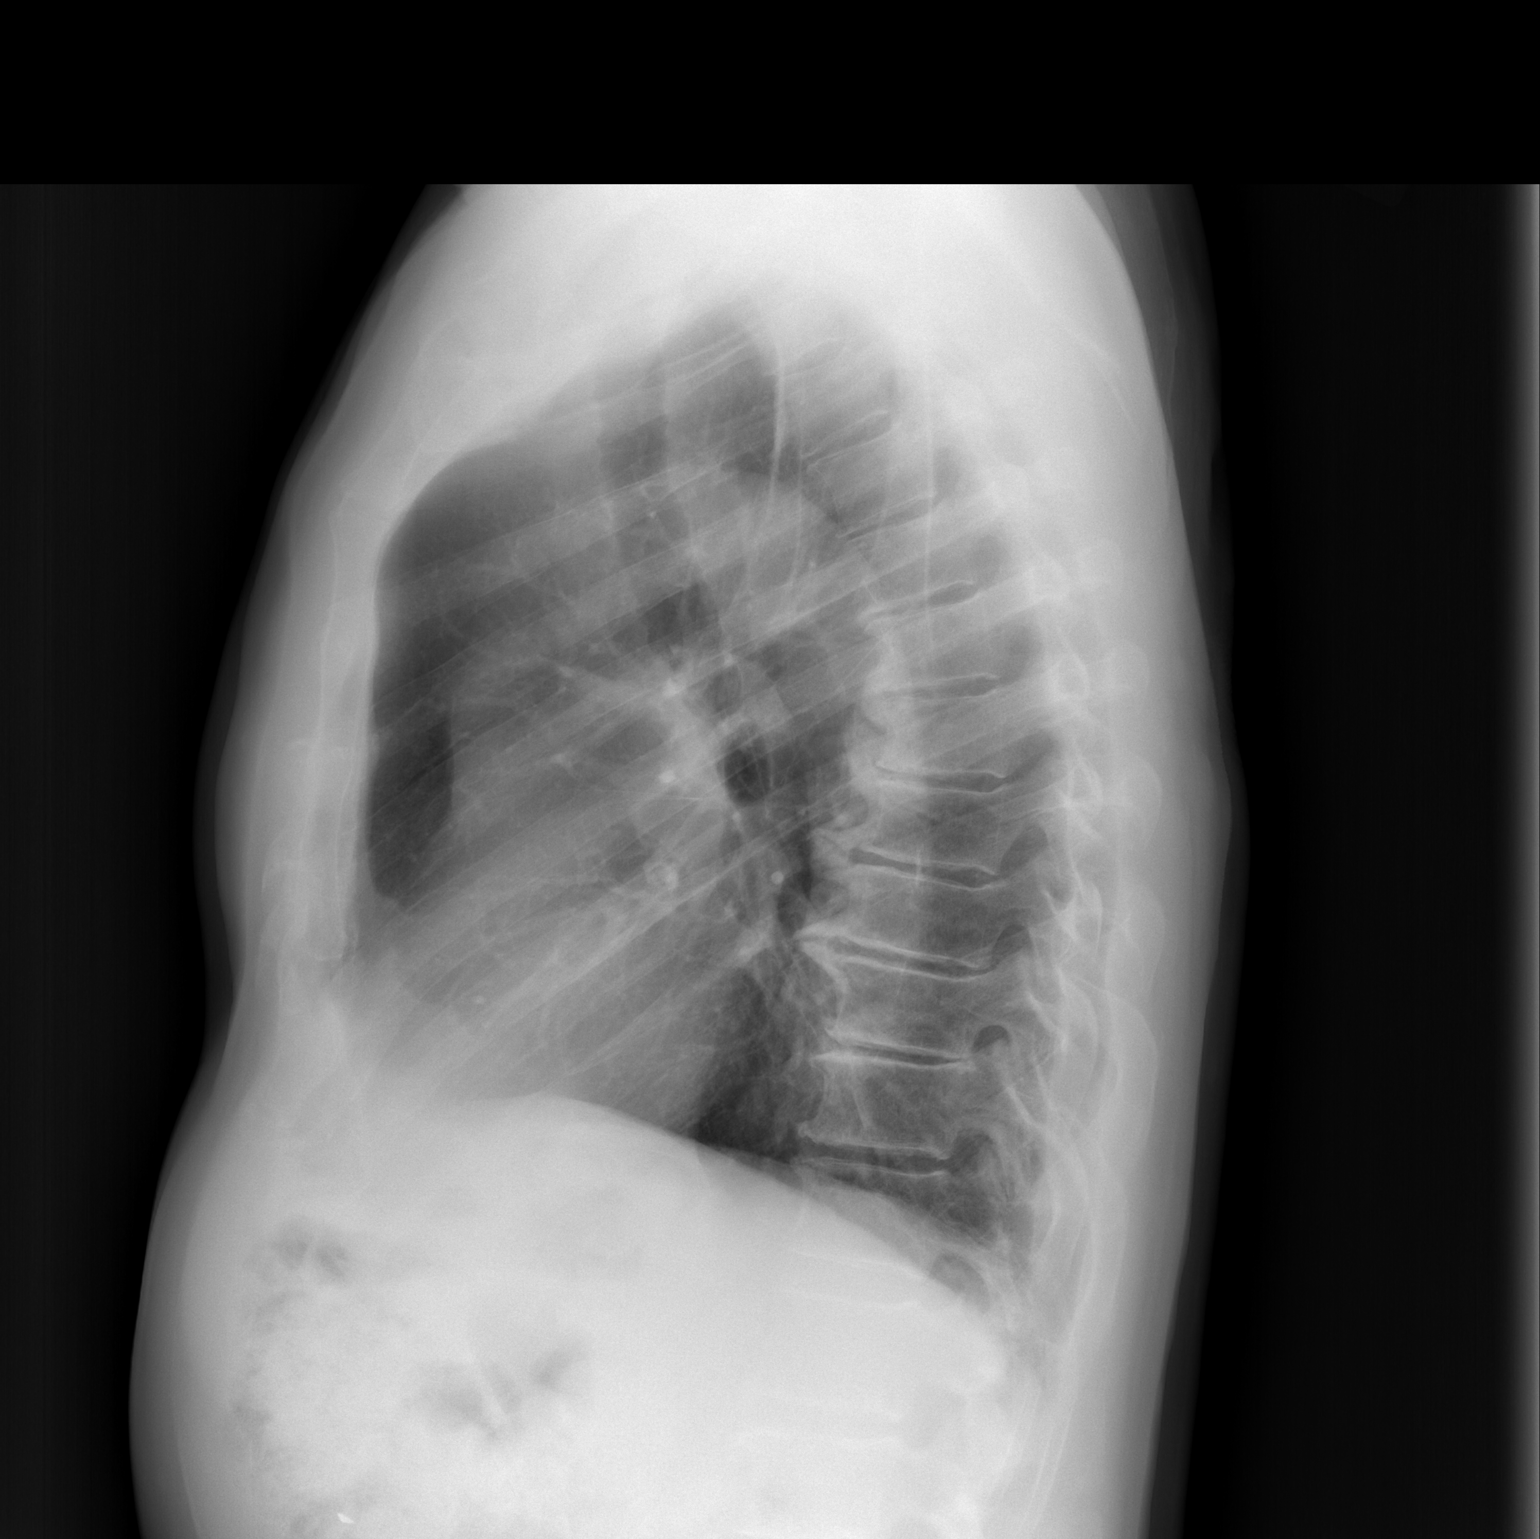

[2 of 2 positions shown; findings below may reference images not displayed]

FINDINGS: Mid thoracic spondylosis is moderate. Midline trachea.
Normal heart size and mediastinal contours for age.  Clear lungs.
IMPRESSION: No acute process or evidence of metastatic disease.

## 2013-09-22 ENCOUNTER — Encounter (HOSPITAL_COMMUNITY): Payer: Self-pay | Admitting: Emergency Medicine

## 2013-09-22 ENCOUNTER — Emergency Department (HOSPITAL_COMMUNITY): Payer: Medicare Other

## 2013-09-22 ENCOUNTER — Emergency Department (HOSPITAL_COMMUNITY)
Admission: EM | Admit: 2013-09-22 | Discharge: 2013-09-23 | Disposition: A | Discharge status: Home / Self Care | Payer: Medicare Other | Attending: Emergency Medicine | Admitting: Emergency Medicine

## 2013-09-22 DIAGNOSIS — Z8719 Personal history of other diseases of the digestive system: Secondary | ICD-10-CM | POA: Insufficient documentation

## 2013-09-22 DIAGNOSIS — Z79899 Other long term (current) drug therapy: Secondary | ICD-10-CM | POA: Insufficient documentation

## 2013-09-22 DIAGNOSIS — F0280 Dementia in other diseases classified elsewhere without behavioral disturbance: Secondary | ICD-10-CM | POA: Diagnosis not present

## 2013-09-22 DIAGNOSIS — R05 Cough: Secondary | ICD-10-CM | POA: Insufficient documentation

## 2013-09-22 DIAGNOSIS — Z7982 Long term (current) use of aspirin: Secondary | ICD-10-CM | POA: Diagnosis not present

## 2013-09-22 DIAGNOSIS — R1110 Vomiting, unspecified: Secondary | ICD-10-CM | POA: Diagnosis not present

## 2013-09-22 DIAGNOSIS — G309 Alzheimer's disease, unspecified: Secondary | ICD-10-CM | POA: Diagnosis not present

## 2013-09-22 DIAGNOSIS — R197 Diarrhea, unspecified: Secondary | ICD-10-CM | POA: Insufficient documentation

## 2013-09-22 DIAGNOSIS — R059 Cough, unspecified: Secondary | ICD-10-CM | POA: Insufficient documentation

## 2013-09-22 DIAGNOSIS — Z85820 Personal history of malignant melanoma of skin: Secondary | ICD-10-CM | POA: Diagnosis not present

## 2013-09-22 DIAGNOSIS — R259 Unspecified abnormal involuntary movements: Secondary | ICD-10-CM | POA: Diagnosis not present

## 2013-09-22 HISTORY — DX: Celiac disease: K90.0

## 2013-09-22 LAB — CBC WITH DIFFERENTIAL/PLATELET
Basophils Absolute: 0.1 10*3/uL (ref 0.0–0.1)
Basophils Relative: 1 % (ref 0–1)
Eosinophils Absolute: 0.1 10*3/uL (ref 0.0–0.7)
Eosinophils Relative: 1 % (ref 0–5)
HEMATOCRIT: 37.5 % — AB (ref 39.0–52.0)
HEMOGLOBIN: 12.1 g/dL — AB (ref 13.0–17.0)
LYMPHS PCT: 10 % — AB (ref 12–46)
Lymphs Abs: 1 10*3/uL (ref 0.7–4.0)
MCH: 29 pg (ref 26.0–34.0)
MCHC: 32.3 g/dL (ref 30.0–36.0)
MCV: 89.9 fL (ref 78.0–100.0)
MONO ABS: 0.4 10*3/uL (ref 0.1–1.0)
Monocytes Relative: 4 % (ref 3–12)
Neutro Abs: 9.1 10*3/uL — ABNORMAL HIGH (ref 1.7–7.7)
Neutrophils Relative %: 86 % — ABNORMAL HIGH (ref 43–77)
PLATELETS: 274 10*3/uL (ref 150–400)
RBC: 4.17 MIL/uL — ABNORMAL LOW (ref 4.22–5.81)
RDW: 14.3 % (ref 11.5–15.5)
WBC: 10.7 10*3/uL — AB (ref 4.0–10.5)

## 2013-09-22 LAB — COMPREHENSIVE METABOLIC PANEL
ALT: 12 U/L (ref 0–53)
AST: 15 U/L (ref 0–37)
Albumin: 4.1 g/dL (ref 3.5–5.2)
Alkaline Phosphatase: 60 U/L (ref 39–117)
Anion gap: 18 — ABNORMAL HIGH (ref 5–15)
BUN: 31 mg/dL — ABNORMAL HIGH (ref 6–23)
CO2: 22 mEq/L (ref 19–32)
Calcium: 9.3 mg/dL (ref 8.4–10.5)
Chloride: 104 mEq/L (ref 96–112)
Creatinine, Ser: 1.77 mg/dL — ABNORMAL HIGH (ref 0.50–1.35)
GFR, EST AFRICAN AMERICAN: 38 mL/min — AB (ref >90)
GFR, EST NON AFRICAN AMERICAN: 33 mL/min — AB (ref >90)
Glucose, Bld: 110 mg/dL — ABNORMAL HIGH (ref 70–99)
Potassium: 4.1 mEq/L (ref 3.7–5.3)
SODIUM: 144 meq/L (ref 137–147)
Total Bilirubin: 0.4 mg/dL (ref 0.3–1.2)
Total Protein: 6.7 g/dL (ref 6.0–8.3)

## 2013-09-22 NOTE — ED Notes (Addendum)
Spouse reported that pt. vomited this evening after supper , presents with chills/shaking , tachypneic  nausea and disoriented to time and place . Denies SOB / no pain .

## 2013-09-22 NOTE — ED Notes (Signed)
Dr Yelverton at bedside.  

## 2013-09-22 NOTE — ED Provider Notes (Signed)
CSN: [id number]     Arrival date & time 09/22/13  2231 History   First MD Initiated Contact with Patient 09/22/13 2300     Chief Complaint  Patient presents with  . Emesis  . Shaking     (Consider location/radiation/quality/duration/timing/severity/associated sxs/prior Treatment) HPI Patient is brought in by his wife for coughing and one episode of vomiting this afternoon. She also states that he has been tremulous. Patient denies any pain or shortness of breath at this time. He states he's had no fever or chills. He denies any abdominal pain. He denies chest pain. He's had no lower extremity swelling or pain. Past Medical History  Diagnosis Date  . Alzheimer disease march 2012  . Melanoma 01/2008    left shoulder/  . 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 Celiac disease    Past Surgical History  Procedure Laterality Date  . Melanoma excision  03/16/2008    left posterior shoulder  . 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 Leg surgery     No family history on file. History  Substance Use Topics  . Smoking status: Never Smoker   . Smokeless tobacco: Not on file  . Alcohol Use: No    Review of Systems  Constitutional: Negative for fever and chills.  Respiratory: Positive for cough. Negative for shortness of breath and wheezing.   Cardiovascular: Negative for chest pain.  Gastrointestinal: Positive for vomiting. Negative for nausea, diarrhea, constipation and blood in stool.  Genitourinary: Negative for dysuria.  Musculoskeletal: Negative for back pain, myalgias, neck pain and neck stiffness.  Skin: Negative for rash and wound.  Neurological: Positive for tremors. Negative for dizziness, weakness, numbness and headaches.  All other systems reviewed and are negative.     Allergies  Review of patient's allergies indicates no known allergies.  Home Medications   Prior to Admission  medications   Medication Sig Start Date End Date Taking? Authorizing Provider  aspirin 81 MG tablet Take 81 mg by mouth daily.   Yes Historical Provider, MD  calcium carbonate (OS-CAL) 600 MG TABS Take 600 mg by mouth daily.   Yes Historical Provider, MD  Cyanocobalamin (VITAMIN B 12 PO) Take by mouth daily.   Yes Historical Provider, MD  Dietary Management Product (AXONA) packet Take 40 g by mouth daily.   Yes Historical Provider, MD  galantamine (RAZADYNE ER) 24 MG 24 hr capsule Take 24 mg by mouth daily with breakfast.   Yes Historical Provider, MD  iron polysaccharides (NIFEREX) 150 MG capsule Take 150 mg by mouth daily.   Yes Historical Provider, MD  lisinopril (PRINIVIL,ZESTRIL) 10 MG tablet Take 10 mg by mouth daily. 03/13/12  Yes Historical Provider, MD  memantine (NAMENDA) 10 MG tablet Take by mouth daily. 28 mg daily   Yes Historical Provider, MD  Multiple Vitamin (MULTIVITAMIN) tablet Take 1 tablet by mouth daily. With iron   Yes Historical Provider, MD  vitamin E 1000 UNIT capsule Take 1,000 Units by mouth 2 (two) times daily.   Yes Historical Provider, MD   BP 101/55  Pulse 73  Temp(Src) 97.9 F (36.6 C) (Oral)  Resp 20  Ht 5\' 4"  (1.626 m)  Wt 132 lb (59.875 kg)  BMI 22.65 kg/m2  SpO2 100% Physical Exam  Nursing note and vitals reviewed. Constitutional: He is oriented to person, place, and time. He appears well-developed and well-nourished. No distress.  HENT:  Head: Normocephalic and atraumatic.  Mouth/Throat: Oropharynx is clear and moist. No oropharyngeal exudate.  Eyes: EOM are normal. Pupils are equal, round, and reactive to light.  Neck: Normal range of motion. Neck supple.  No meningismus  Cardiovascular: Normal rate and regular rhythm.   Pulmonary/Chest: Effort normal and breath sounds normal. No respiratory distress. He has no wheezes. He has no rales. He exhibits no tenderness.  Abdominal: Soft. Bowel sounds are normal. He exhibits no distension and no mass.  There is no tenderness. There is no rebound and no guarding.  Musculoskeletal: Normal range of motion. He exhibits no edema and no tenderness.  No CVA tenderness bilaterally.  Neurological: He is alert and oriented to person, place, and time.  5/5 motor in all extremities. Sensation is intact.  Skin: Skin is warm and dry. No rash noted. No erythema.  Psychiatric: He has a normal mood and affect. His behavior is normal.    ED Course  Procedures (including critical care time) Labs Review Labs Reviewed  CBC WITH DIFFERENTIAL - Abnormal; Notable for the following:    WBC 10.7 (*)    RBC 4.17 (*)    Hemoglobin 12.1 (*)    HCT 37.5 (*)    Neutrophils Relative % 86 (*)    Neutro Abs 9.1 (*)    Lymphocytes Relative 10 (*)    All other components within normal limits  COMPREHENSIVE METABOLIC PANEL  URINALYSIS, ROUTINE W REFLEX MICROSCOPIC    Imaging Review No results found.   EKG Interpretation   Date/Time:  Tuesday September 22 2013 22:53:50 EDT Ventricular Rate:  74 PR Interval:  102 QRS Duration: 80 QT Interval:  400 QTC Calculation: 444 R Axis:   59 Text Interpretation:  Sinus rhythm with short PR Otherwise normal ECG  Confirmed by Juandaniel Manfredo  MD, Damiah Mcdonald ([id number]) on 09/23/2013 6:47:07 AM      MDM   Final diagnoses:  None   Labs within normal limits except for mildly elevated white blood cell count. Patient had one episode of watery diarrhea while in the emergency department. His vital signs remained stable. His ankle to where he without assistance. His abdomen is soft and nontender. Question gastroenteritis as the cause for his symptoms. Both the husband and the wife are anxious to be discharged home. She's been advised to followup with his primary Dr. Burnis Medin discharge home with antiemetics. Strict return precautions have been given.     Julianne Rice, MD 09/23/13 [phone]

## 2013-09-22 NOTE — ED Notes (Signed)
Pt c/o emesis since 7pm after eating supper. Wife reports pt vomited after dinner and had shortness of breath. PT has hx of alzheimer's disease; wife reports pt appears more short of breath after exertion. Pt denies pain or nausea at this time.

## 2013-09-23 LAB — URINALYSIS, ROUTINE W REFLEX MICROSCOPIC
BILIRUBIN URINE: NEGATIVE
Glucose, UA: NEGATIVE mg/dL
Hgb urine dipstick: NEGATIVE
Ketones, ur: 15 mg/dL — AB
Leukocytes, UA: NEGATIVE
Nitrite: NEGATIVE
PROTEIN: NEGATIVE mg/dL
Specific Gravity, Urine: 1.022 (ref 1.005–1.030)
UROBILINOGEN UA: 0.2 mg/dL (ref 0.0–1.0)
pH: 5 (ref 5.0–8.0)

## 2013-09-23 MED ORDER — SODIUM CHLORIDE 0.9 % IV BOLUS (SEPSIS)
500.0000 mL | Freq: Once | INTRAVENOUS | Status: DC
Start: 2013-09-23 — End: 2013-09-23

## 2013-09-23 NOTE — ED Notes (Signed)
See Paper Charting

## 2014-08-26 ENCOUNTER — Encounter (HOSPITAL_COMMUNITY): Payer: Self-pay | Admitting: Vascular Surgery

## 2014-08-26 ENCOUNTER — Emergency Department (HOSPITAL_COMMUNITY)
Admission: EM | Admit: 2014-08-26 | Discharge: 2014-08-27 | Disposition: A | Discharge status: Home / Self Care | Payer: Medicare Other | Attending: Emergency Medicine | Admitting: Emergency Medicine

## 2014-08-26 ENCOUNTER — Emergency Department (HOSPITAL_COMMUNITY): Payer: Medicare Other

## 2014-08-26 DIAGNOSIS — Z859 Personal history of malignant neoplasm, unspecified: Secondary | ICD-10-CM | POA: Insufficient documentation

## 2014-08-26 DIAGNOSIS — Z7982 Long term (current) use of aspirin: Secondary | ICD-10-CM | POA: Insufficient documentation

## 2014-08-26 DIAGNOSIS — Z8719 Personal history of other diseases of the digestive system: Secondary | ICD-10-CM | POA: Diagnosis not present

## 2014-08-26 DIAGNOSIS — Y92007 Garden or yard of unspecified non-institutional (private) residence as the place of occurrence of the external cause: Secondary | ICD-10-CM | POA: Insufficient documentation

## 2014-08-26 DIAGNOSIS — Z85820 Personal history of malignant melanoma of skin: Secondary | ICD-10-CM | POA: Insufficient documentation

## 2014-08-26 DIAGNOSIS — Z79899 Other long term (current) drug therapy: Secondary | ICD-10-CM | POA: Insufficient documentation

## 2014-08-26 DIAGNOSIS — W57XXXA Bitten or stung by nonvenomous insect and other nonvenomous arthropods, initial encounter: Secondary | ICD-10-CM

## 2014-08-26 DIAGNOSIS — S40861A Insect bite (nonvenomous) of right upper arm, initial encounter: Secondary | ICD-10-CM | POA: Diagnosis present

## 2014-08-26 DIAGNOSIS — G309 Alzheimer's disease, unspecified: Secondary | ICD-10-CM | POA: Diagnosis not present

## 2014-08-26 DIAGNOSIS — Y998 Other external cause status: Secondary | ICD-10-CM | POA: Diagnosis not present

## 2014-08-26 DIAGNOSIS — T63481A Toxic effect of venom of other arthropod, accidental (unintentional), initial encounter: Secondary | ICD-10-CM | POA: Insufficient documentation

## 2014-08-26 DIAGNOSIS — Y9389 Activity, other specified: Secondary | ICD-10-CM | POA: Insufficient documentation

## 2014-08-26 DIAGNOSIS — X58XXXA Exposure to other specified factors, initial encounter: Secondary | ICD-10-CM | POA: Insufficient documentation

## 2014-08-26 DIAGNOSIS — M79641 Pain in right hand: Secondary | ICD-10-CM

## 2014-08-26 MED ORDER — HYDROCORTISONE 1 % EX CREA
TOPICAL_CREAM | Freq: Three times a day (TID) | CUTANEOUS | Status: DC
  Administered 2014-08-26: 1 via TOPICAL
  Filled 2014-08-26: qty 28

## 2014-08-26 MED ORDER — ACETAMINOPHEN 500 MG PO TABS
1000.0000 mg | ORAL_TABLET | Freq: Once | ORAL | Status: AC
  Administered 2014-08-26: 1000 mg via ORAL
  Filled 2014-08-26: qty 2

## 2014-08-26 NOTE — ED Provider Notes (Signed)
CSN: [id number]   Arrival date & time 08/26/14 2200  History  This chart was scribed for non-physician practitioner, Abigail Butts PA-C, working with Blanchie Dessert, MD by Altamease Oiler, ED Scribe. This patient was seen in room TR11C/TR11C and the patient's care was started at 10:47 PM.  Chief Complaint  Patient presents with  . Insect Bite    HPI The history is provided by the patient, medical records and the spouse. No language interpreter was used.   Kyle York is a 81 y.o. male with PMHx of Alzheimer's disease who presents to the Emergency Department complaining of painful  insect bites to the bilateral arms with onset around 7 PM tonight. The pt was outside in his yard gathering leaves and came in to tell his wife that "they bit me everywhere". He is not able to say what bit him. The pt has had an area of increasing redness and pain in the right thenar eminence for 3 days. Pt denies itching. No fever or chills per wife. NO hx of immunosuppression, diabetes or allergic reaction.    Level 5 caveat - Hx largely provided by wife.    Past Medical History  Diagnosis Date  . Alzheimer disease march 2012  . Melanoma 01/2008    left shoulder/  . 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 Celiac disease     Past Surgical History  Procedure Laterality Date  . Melanoma excision  03/16/2008    left posterior shoulder  . 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 Leg surgery      No family history on file.  History  Substance Use Topics  . Smoking status: Never Smoker   . Smokeless tobacco: Not on file  . Alcohol Use: No     Review of Systems  Skin:       Painful lesions to both arms An area of redness and pain at the right palm    Home Medications   Prior to Admission medications   Medication Sig Start Date End Date Taking? Authorizing Provider  aspirin 81 MG tablet Take 81 mg by  mouth daily.    Historical Provider, MD  calcium carbonate (OS-CAL) 600 MG TABS Take 600 mg by mouth daily.    Historical Provider, MD  Cyanocobalamin (VITAMIN B 12 PO) Take by mouth daily.    Historical Provider, MD  Dietary Management Product (AXONA) packet Take 40 g by mouth daily.    Historical Provider, MD  galantamine (RAZADYNE ER) 24 MG 24 hr capsule Take 24 mg by mouth daily with breakfast.    Historical Provider, MD  hydrocortisone 2.5 % lotion Apply topically 2 (two) times daily. 08/27/14   Chari Parmenter, PA-C  iron polysaccharides (NIFEREX) 150 MG capsule Take 150 mg by mouth daily.    Historical Provider, MD  lisinopril (PRINIVIL,ZESTRIL) 10 MG tablet Take 10 mg by mouth daily. 03/13/12   Historical Provider, MD  memantine (NAMENDA) 10 MG tablet Take by mouth daily. 28 mg daily    Historical Provider, MD  Multiple Vitamin (MULTIVITAMIN) tablet Take 1 tablet by mouth daily. With iron    Historical Provider, MD  vitamin E 1000 UNIT capsule Take 1,000 Units by mouth 2 (two) times daily.    Historical Provider, MD    Allergies  Review of patient's allergies indicates no known allergies.  Triage Vitals: BP 114/58 mmHg  Pulse 64  Temp(Src) 97.9  F (36.6 C) (Oral)  Resp 16  SpO2 98%  Physical Exam  Constitutional: He is oriented to person, place, and time. He appears well-developed and well-nourished. No distress.  Awake, alert, nontoxic appearance  HENT:  Head: Normocephalic and atraumatic.  Right Ear: Tympanic membrane, external ear and ear canal normal.  Left Ear: Tympanic membrane, external ear and ear canal normal.  Nose: Nose normal. No mucosal edema or rhinorrhea.  Mouth/Throat: Uvula is midline and oropharynx is clear and moist. No uvula swelling. No oropharyngeal exudate, posterior oropharyngeal edema, posterior oropharyngeal erythema or tonsillar abscesses.  No swelling of the uvula or oropharynx   Eyes: Conjunctivae are normal. No scleral icterus.  Neck: Normal  range of motion. Neck supple.  Patent airway No stridor; normal phonation Handling secretions without difficulty  Cardiovascular: Normal rate, regular rhythm, normal heart sounds and intact distal pulses.   No murmur heard. Pulmonary/Chest: Effort normal and breath sounds normal. No stridor. No respiratory distress. He has no wheezes.  Equal chest expansion  Abdominal: Soft. Bowel sounds are normal. He exhibits no mass. There is no tenderness. There is no rebound and no guarding.  Musculoskeletal: Normal range of motion. He exhibits no edema.  Neurological: He is alert and oriented to person, place, and time.  Speech is clear and goal oriented Moves extremities without ataxia  Skin: Skin is warm and dry. Rash noted. He is not diaphoretic.  Multiple erythematous and edematous lesions on the upper arms which are tender to palpation, most of which are associated with a punctate central ulcer consistent with an insect bite No induration, areas of fluctuance, no excoriations   Psychiatric: He has a normal mood and affect.  Nursing note and vitals reviewed.   ED Course  Procedures   DIAGNOSTIC STUDIES: Oxygen Saturation is 98% on RA, normal by my interpretation.    COORDINATION OF CARE: 10:51 PM Discussed treatment plan which includes right hand XR and Tylenol with pt and his wife at bedside and pt agreed to plan.  Labs Review- Labs Reviewed - No data to display  Imaging Review Dg Hand Complete Right  08/26/2014   CLINICAL DATA:  Patient was stung by insect while working in the yd at [employer] hours today. Bruising on the anterior portion of the right first metacarpal area.  EXAM: RIGHT HAND - COMPLETE 3+ VIEW  COMPARISON:  None.  FINDINGS: Diffuse bone demineralization. Degenerative changes involving interphalangeal joints, first carpometacarpal joint, and intercarpal joints. Old ununited ossicle at the ulnar styloid process. No evidence of acute fracture or dislocation. Soft tissues are  unremarkable.  IMPRESSION: Degenerative changes in the right hand and wrist. No acute bony abnormalities.   Electronically Signed   By: Lucienne Capers M.D.   On: 08/26/2014 23:57    EKG Interpretation None      MDM   Final diagnoses:  Insect bites and stings, accidental or unintentional, initial encounter  Right hand pain   Audra Bellard presents with multiple insect bites to the right hand, bilateral forearms.  No evidence of secondary infection.  Mild TTP and edema of each individual bite site.  Significant tenderness to the right thenar eminence with ecchymosis.  Will x-ray to r/o fracture as pt is not reliable with dementia.    12:17 AM X-ray without acute abnormality.  No foreign body or fracture.  Arthritis noted.  Pt with improvement of insect bites after hydrocortisone lotion applied.  Pt will ne d/c home with same and recommend Tylenol for pain.  BP 114/58 mmHg  Pulse 64  Temp(Src) 97.9 F (36.6 C) (Oral)  Resp 16  SpO2 98%  I personally performed the services described in this documentation, which was scribed in my presence. The recorded information has been reviewed and is accurate.  The patient was discussed with and seen by Dr. Maryan Rued who agrees with the treatment plan.    Jarrett Soho Graison Leinberger, PA-C 08/27/14 0020  Blanchie Dessert, MD 08/27/14 2222

## 2014-08-26 NOTE — ED Notes (Signed)
While working in the yard raking leaves at [employer] pt suspects he was stung by insects on his bilateral arms.  Multiple raised reddened areas noted to bilat arms.  Right palear thumb area noted to be bruised with exquisite pain on palpation.  CNS intact.  Palpable radial pulses bilaterally.  Right hand pain rated 10/10.  Wife at bedside

## 2014-08-26 NOTE — ED Notes (Signed)
Pt reports to the ED for eval of possible insect bites to bilateral arms and his right palm. He was working in the yard tonight picking up leaves and when he came in he noticed the bites. Pt reports they are very painful and tender to touch. Several arewas of erythema noted on bilateral arms and right palm. Denies any itching. Denies any SOB. No oral swelling or facial involvement. Pt A&Ox at baseline (hx of demenita), resp e/u, and skin warm and dry. He is not a diabetic.

## 2014-08-27 MED ORDER — HYDROCORTISONE 2.5 % EX LOTN: TOPICAL_LOTION | Freq: Two times a day (BID) | CUTANEOUS | Status: DC

## 2014-08-27 NOTE — Discharge Instructions (Signed)
1. Medications: hydrocortisone lotion, usual home medications 2. Treatment: rest, drink plenty of fluids, take medications as prescribed, tylenol for pain 3. Follow Up: Please followup with your primary doctor in 3 days for discussion of your diagnoses and further evaluation after today's visit; if you do not have a primary care doctor use the resource guide provided to find one; followup with dermatology as needed; Return to the ER for difficulty breathing, return of allergic reaction or other concerning symptoms    Insect Bite Mosquitoes, flies, fleas, bedbugs, and many other insects can bite. Insect bites are different from insect stings. A sting is when venom is injected into the skin. Some insect bites can transmit infectious diseases. SYMPTOMS  Insect bites usually turn red, swell, and itch for 2 to 4 days. They often go away on their own. TREATMENT  Your caregiver may prescribe antibiotic medicines if a bacterial infection develops in the bite. HOME CARE INSTRUCTIONS  Do not scratch the bite area.  Keep the bite area clean and dry. Wash the bite area thoroughly with soap and water.  Put ice or cool compresses on the bite area.  Put ice in a plastic bag.  Place a towel between your skin and the bag.  Leave the ice on for 20 minutes, 4 times a day for the first 2 to 3 days, or as directed.  You may apply a baking soda paste, cortisone cream, or calamine lotion to the bite area as directed by your caregiver. This can help reduce itching and swelling.  Only take over-the-counter or prescription medicines as directed by your caregiver.  If you are given antibiotics, take them as directed. Finish them even if you start to feel better. You may need a tetanus shot if:  You cannot remember when you had your last tetanus shot.  You have never had a tetanus shot.  The injury broke your skin. If you get a tetanus shot, your arm may swell, get red, and feel warm to the touch. This is  common and not a problem. If you need a tetanus shot and you choose not to have one, there is a rare chance of getting tetanus. Sickness from tetanus can be serious. SEEK IMMEDIATE MEDICAL CARE IF:   You have increased pain, redness, or swelling in the bite area.  You see a red line on the skin coming from the bite.  You have a fever.  You have joint pain.  You have a headache or neck pain.  You have unusual weakness.  You have a rash.  You have chest pain or shortness of breath.  You have abdominal pain, nausea, or vomiting.  You feel unusually tired or sleepy. MAKE SURE YOU:   Understand these instructions.  Will watch your condition.  Will get help right away if you are not doing well or get worse. Document Released: 03/08/2004 Document Revised: 04/23/2011 Document Reviewed: 08/30/2010 [facility] Patient Information 2015 [place], [place]. This information is not intended to replace advice given to you by your health care provider. Make sure you discuss any questions you have with your health care provider.

## 2017-01-08 ENCOUNTER — Emergency Department (HOSPITAL_BASED_OUTPATIENT_CLINIC_OR_DEPARTMENT_OTHER): Payer: Medicare Other

## 2017-01-08 ENCOUNTER — Other Ambulatory Visit: Payer: Self-pay

## 2017-01-08 ENCOUNTER — Encounter (HOSPITAL_BASED_OUTPATIENT_CLINIC_OR_DEPARTMENT_OTHER): Payer: Self-pay | Admitting: Emergency Medicine

## 2017-01-08 ENCOUNTER — Emergency Department (HOSPITAL_BASED_OUTPATIENT_CLINIC_OR_DEPARTMENT_OTHER)
Admission: EM | Admit: 2017-01-08 | Discharge: 2017-01-08 | Disposition: A | Discharge status: Home / Self Care | Payer: Medicare Other | Attending: Emergency Medicine | Admitting: Emergency Medicine

## 2017-01-08 DIAGNOSIS — S0001XA Abrasion of scalp, initial encounter: Secondary | ICD-10-CM | POA: Diagnosis not present

## 2017-01-08 DIAGNOSIS — Y9301 Activity, walking, marching and hiking: Secondary | ICD-10-CM | POA: Diagnosis not present

## 2017-01-08 DIAGNOSIS — Z7982 Long term (current) use of aspirin: Secondary | ICD-10-CM | POA: Insufficient documentation

## 2017-01-08 DIAGNOSIS — Z23 Encounter for immunization: Secondary | ICD-10-CM | POA: Insufficient documentation

## 2017-01-08 DIAGNOSIS — Z79899 Other long term (current) drug therapy: Secondary | ICD-10-CM | POA: Insufficient documentation

## 2017-01-08 DIAGNOSIS — Z85828 Personal history of other malignant neoplasm of skin: Secondary | ICD-10-CM | POA: Diagnosis not present

## 2017-01-08 DIAGNOSIS — Y998 Other external cause status: Secondary | ICD-10-CM | POA: Diagnosis not present

## 2017-01-08 DIAGNOSIS — W01198A Fall on same level from slipping, tripping and stumbling with subsequent striking against other object, initial encounter: Secondary | ICD-10-CM | POA: Diagnosis not present

## 2017-01-08 DIAGNOSIS — S0990XA Unspecified injury of head, initial encounter: Secondary | ICD-10-CM | POA: Diagnosis present

## 2017-01-08 DIAGNOSIS — G309 Alzheimer's disease, unspecified: Secondary | ICD-10-CM | POA: Insufficient documentation

## 2017-01-08 DIAGNOSIS — Y929 Unspecified place or not applicable: Secondary | ICD-10-CM | POA: Insufficient documentation

## 2017-01-08 DIAGNOSIS — W19XXXA Unspecified fall, initial encounter: Secondary | ICD-10-CM

## 2017-01-08 MED ORDER — TETANUS-DIPHTH-ACELL PERTUSSIS 5-2.5-18.5 LF-MCG/0.5 IM SUSP
0.5000 mL | Freq: Once | INTRAMUSCULAR | Status: AC
  Administered 2017-01-08: 0.5 mL via INTRAMUSCULAR
  Filled 2017-01-08: qty 0.5

## 2017-01-08 NOTE — ED Notes (Signed)
Pt ambulated without difficulty with EMT and RN's help.

## 2017-01-08 NOTE — ED Provider Notes (Signed)
[facility] EM[facility]ENCY DEPARTMENT Provider Note   CSN: [id number] Arrival date & time: 01/08/17  1557  LEVEL 5 CAVEAT - DEMENTIA   History   Chief Complaint Chief Complaint  Patient presents with  . Fall    HPI Kyle York is a 83 y.o. male.  HPI  83 year old male with a history of Alzheimer's presents after a fall.  He was with his wife taking the dog to the vet and he fell backwards.  Patient did not complain of anything prior to the fall.  He has some chronic issues with his leg sometimes makes it difficult to walk.  He fell backward striking his head.  He did not lose consciousness.  He has no current complaints.  Unsure of when his last tetanus immunization was.  Past Medical History:  Diagnosis Date  . Alzheimer disease march 2012  . 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 Celiac disease   . Melanoma ([place]) 01/2008   left shoulder/    Patient Active Problem List   Diagnosis Date Noted  . Cancer ([place]) 05/14/2011  . Alzheimer disease 04/13/2010    Past Surgical History:  Procedure Laterality Date  . LEG SURGERY    . MELANOMA EXCISION  03/16/2008   left posterior shoulder  . 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 Authorizing Provider  aspirin 81 MG tablet Take 81 mg by mouth daily.    [provider]  calcium carbonate (OS-CAL) 600 MG TABS Take 600 mg by mouth daily.    [provider]  Cyanocobalamin (VITAMIN B 12 PO) Take by mouth daily.    [provider]  Dietary Management Product (AXONA) packet Take 40 g by mouth daily.    [provider]  galantamine (RAZADYNE ER) 24 MG 24 hr capsule Take 24 mg by mouth daily with breakfast.    [provider]  hydrocortisone 2.5 % lotion Apply topically 2 (two) times  daily. 08/27/14   Muthersbaugh, Jarrett Soho, PA-C  iron polysaccharides (NIFEREX) 150 MG capsule Take 150 mg by mouth daily.    [provider]  lisinopril (PRINIVIL,ZESTRIL) 10 MG tablet Take 10 mg by mouth daily. 03/13/12   [provider]  memantine (NAMENDA) 10 MG tablet Take by mouth daily. 28 mg daily    [provider]  Multiple Vitamin (MULTIVITAMIN) tablet Take 1 tablet by mouth daily. With iron    [provider]  vitamin E 1000 UNIT capsule Take 1,000 Units by mouth 2 (two) times daily.    [provider]    Family History No family history on file.  Social History Social History   Tobacco Use  . Smoking status: Never Smoker  . Smokeless tobacco: Never Used  Substance Use Topics  . Alcohol use: No  . Drug use: No     Allergies   Patient has no known allergies.   Review of Systems Review of Systems  Unable to perform ROS: Dementia     Physical Exam Updated Vital Signs BP (!) 163/76   Pulse 71   Temp 98.3 F (36.8 C) (Oral)   Resp 16   Ht 5\' 4"  (1.626 m)   Wt 61.2 kg (135 lb)   SpO2 100%   BMI 23.17 kg/m   Physical Exam  Constitutional: He appears well-developed and well-nourished.  HENT:  Head: Normocephalic. Head is with abrasion.    Right Ear: External ear normal.  Left Ear: External ear normal.  Nose: Nose normal.  Eyes: Right eye exhibits no discharge. Left eye exhibits no discharge.  Neck: Neck supple. No spinous process tenderness and no muscular tenderness present.  Cardiovascular: Normal rate, regular rhythm and normal heart sounds.  Pulmonary/Chest: Effort normal and breath sounds normal.  Abdominal: Soft. There is no tenderness.  Musculoskeletal: He exhibits no edema.       Right hip: He exhibits normal range of motion and no tenderness.       Left hip: He exhibits normal range of motion and no tenderness.  Neurological: He is alert. He is disoriented.  CN 3-12 grossly intact. 5/5 strength in all  4 extremities. Grossly normal sensation.   Skin: Skin is warm and dry.  Nursing note and vitals reviewed.    ED Treatments / Results  Labs (all labs ordered are listed, but only abnormal results are displayed) Labs Reviewed - No data to display  EKG  EKG Interpretation None       Radiology Ct Head Wo Contrast  Result Date: 01/08/2017 CLINICAL DATA:  Fall backwards in parking not with laceration to posterior head. Initial encounter. EXAM: CT HEAD WITHOUT CONTRAST TECHNIQUE: Contiguous axial images were obtained from the base of the skull through the vertex without intravenous contrast. COMPARISON:  09/21/2016 head CT at [facility] FINDINGS: Brain: Stable advanced cortical atrophy and small vessel disease in the periventricular white matter. The brain demonstrates no evidence of acute hemorrhage, infarction, edema, mass effect, extra-axial fluid collection, hydrocephalus or mass lesion. Vascular: No hyperdense vessel or unexpected calcification. Skull: Normal. Negative for fracture or focal lesion. Sinuses/Orbits: No acute finding. Other: Focal scalp soft tissue swelling is present overlying the posterior vertex. No scalp foreign body identified. IMPRESSION: No acute findings.  Stable atrophy and small vessel disease. Electronically Signed   By: Aletta Edouard M.D.   On: 01/08/2017 16:42    Procedures Procedures (including critical care time)  Medications Ordered in ED Medications  Tdap (BOOSTRIX) injection 0.5 mL (0.5 mLs Intramuscular Given 01/08/17 1637)     Initial Impression / Assessment and Plan / ED Course  I have reviewed the triage vital signs and the nursing notes.  Pertinent labs & imaging results that were available during my care of the patient were reviewed by me and considered in my medical decision making (see chart for details).     CT head without acute abnormalities. Superficial abrasion to posterior scalp, no laceration. This was cleaned and  dressed. tdap updated. Acting at baseline per wife. Able to ambulate, no hip pain/tenderness. D/c home with return precautions.  Final Clinical Impressions(s) / ED Diagnoses   Final diagnoses:  Fall, initial encounter  Abrasion of scalp, initial encounter    ED Discharge Orders    None       Sherwood Gambler, MD 01/08/17 1733

## 2017-01-08 NOTE — ED Triage Notes (Signed)
Pt to ED via EMS s/p fall backwards in parking lot; denies LOC

## 2018-05-27 ENCOUNTER — Emergency Department (HOSPITAL_COMMUNITY): Payer: Medicare Other

## 2018-05-27 ENCOUNTER — Other Ambulatory Visit: Payer: Self-pay

## 2018-05-27 ENCOUNTER — Inpatient Hospital Stay (HOSPITAL_COMMUNITY)
Admission: EM | Admit: 2018-05-27 | Discharge: 2018-05-30 | DRG: 871 | Disposition: A | Discharge status: Hospice | Payer: Medicare Other | Source: Skilled Nursing Facility | Attending: Family Medicine | Admitting: Family Medicine

## 2018-05-27 ENCOUNTER — Encounter (HOSPITAL_COMMUNITY): Payer: Self-pay

## 2018-05-27 DIAGNOSIS — Z515 Encounter for palliative care: Secondary | ICD-10-CM | POA: Diagnosis not present

## 2018-05-27 DIAGNOSIS — Z993 Dependence on wheelchair: Secondary | ICD-10-CM

## 2018-05-27 DIAGNOSIS — M6249 Contracture of muscle, multiple sites: Secondary | ICD-10-CM | POA: Diagnosis present

## 2018-05-27 DIAGNOSIS — J690 Pneumonitis due to inhalation of food and vomit: Secondary | ICD-10-CM | POA: Diagnosis present

## 2018-05-27 DIAGNOSIS — I10 Essential (primary) hypertension: Secondary | ICD-10-CM | POA: Diagnosis present

## 2018-05-27 DIAGNOSIS — F419 Anxiety disorder, unspecified: Secondary | ICD-10-CM | POA: Diagnosis present

## 2018-05-27 DIAGNOSIS — F0391 Unspecified dementia with behavioral disturbance: Secondary | ICD-10-CM

## 2018-05-27 DIAGNOSIS — Z7982 Long term (current) use of aspirin: Secondary | ICD-10-CM | POA: Diagnosis not present

## 2018-05-27 DIAGNOSIS — Z20828 Contact with and (suspected) exposure to other viral communicable diseases: Secondary | ICD-10-CM | POA: Diagnosis present

## 2018-05-27 DIAGNOSIS — E860 Dehydration: Secondary | ICD-10-CM

## 2018-05-27 DIAGNOSIS — F03918 Unspecified dementia, unspecified severity, with other behavioral disturbance: Secondary | ICD-10-CM

## 2018-05-27 DIAGNOSIS — R633 Feeding difficulties: Secondary | ICD-10-CM | POA: Diagnosis present

## 2018-05-27 DIAGNOSIS — K900 Celiac disease: Secondary | ICD-10-CM | POA: Diagnosis present

## 2018-05-27 DIAGNOSIS — A419 Sepsis, unspecified organism: Secondary | ICD-10-CM | POA: Diagnosis present

## 2018-05-27 DIAGNOSIS — Z79899 Other long term (current) drug therapy: Secondary | ICD-10-CM | POA: Diagnosis not present

## 2018-05-27 DIAGNOSIS — Z66 Do not resuscitate: Secondary | ICD-10-CM | POA: Diagnosis present

## 2018-05-27 DIAGNOSIS — R627 Adult failure to thrive: Secondary | ICD-10-CM | POA: Diagnosis present

## 2018-05-27 DIAGNOSIS — Z7189 Other specified counseling: Secondary | ICD-10-CM | POA: Diagnosis not present

## 2018-05-27 DIAGNOSIS — F329 Major depressive disorder, single episode, unspecified: Secondary | ICD-10-CM | POA: Diagnosis present

## 2018-05-27 DIAGNOSIS — N400 Enlarged prostate without lower urinary tract symptoms: Secondary | ICD-10-CM | POA: Diagnosis present

## 2018-05-27 DIAGNOSIS — G309 Alzheimer's disease, unspecified: Secondary | ICD-10-CM | POA: Diagnosis present

## 2018-05-27 DIAGNOSIS — F0281 Dementia in other diseases classified elsewhere with behavioral disturbance: Secondary | ICD-10-CM | POA: Diagnosis present

## 2018-05-27 DIAGNOSIS — Z6822 Body mass index (BMI) 22.0-22.9, adult: Secondary | ICD-10-CM

## 2018-05-27 DIAGNOSIS — E039 Hypothyroidism, unspecified: Secondary | ICD-10-CM | POA: Diagnosis present

## 2018-05-27 DIAGNOSIS — R6520 Severe sepsis without septic shock: Secondary | ICD-10-CM | POA: Diagnosis not present

## 2018-05-27 LAB — CBC WITH DIFFERENTIAL/PLATELET
Abs Immature Granulocytes: 0.06 10*3/uL (ref 0.00–0.07)
Basophils Absolute: 0 10*3/uL (ref 0.0–0.1)
Basophils Relative: 0 %
Eosinophils Absolute: 0.1 10*3/uL (ref 0.0–0.5)
Eosinophils Relative: 1 %
HCT: 36.5 % — ABNORMAL LOW (ref 39.0–52.0)
Hemoglobin: 11 g/dL — ABNORMAL LOW (ref 13.0–17.0)
Immature Granulocytes: 1 %
Lymphocytes Relative: 9 %
Lymphs Abs: 0.6 10*3/uL — ABNORMAL LOW (ref 0.7–4.0)
MCH: 28.3 pg (ref 26.0–34.0)
MCHC: 30.1 g/dL (ref 30.0–36.0)
MCV: 93.8 fL (ref 80.0–100.0)
Monocytes Absolute: 0.5 10*3/uL (ref 0.1–1.0)
Monocytes Relative: 7 %
Neutro Abs: 5.6 10*3/uL (ref 1.7–7.7)
Neutrophils Relative %: 82 %
Platelets: 109 10*3/uL — ABNORMAL LOW (ref 150–400)
RBC: 3.89 MIL/uL — ABNORMAL LOW (ref 4.22–5.81)
RDW: 14.8 % (ref 11.5–15.5)
WBC: 6.8 10*3/uL (ref 4.0–10.5)
nRBC: 0.4 % — ABNORMAL HIGH (ref 0.0–0.2)

## 2018-05-27 LAB — COMPREHENSIVE METABOLIC PANEL
ALT: 29 U/L (ref 0–44)
AST: 18 U/L (ref 15–41)
Albumin: 3 g/dL — ABNORMAL LOW (ref 3.5–5.0)
Alkaline Phosphatase: 76 U/L (ref 38–126)
Anion gap: 9 (ref 5–15)
BUN: 28 mg/dL — ABNORMAL HIGH (ref 8–23)
CO2: 29 mmol/L (ref 22–32)
Calcium: 9 mg/dL (ref 8.9–10.3)
Chloride: 108 mmol/L (ref 98–111)
Creatinine, Ser: 1.57 mg/dL — ABNORMAL HIGH (ref 0.61–1.24)
GFR calc Af Amer: 44 mL/min — ABNORMAL LOW (ref >60)
GFR calc non Af Amer: 38 mL/min — ABNORMAL LOW (ref >60)
Glucose, Bld: 77 mg/dL (ref 70–99)
Potassium: 4.2 mmol/L (ref 3.5–5.1)
Sodium: 146 mmol/L — ABNORMAL HIGH (ref 135–145)
Total Bilirubin: 0.4 mg/dL (ref 0.3–1.2)
Total Protein: 5.4 g/dL — ABNORMAL LOW (ref 6.5–8.1)

## 2018-05-27 LAB — URINALYSIS, ROUTINE W REFLEX MICROSCOPIC
Bilirubin Urine: NEGATIVE
Glucose, UA: NEGATIVE mg/dL
Hgb urine dipstick: NEGATIVE
Ketones, ur: 15 mg/dL — AB
Leukocytes,Ua: NEGATIVE
Nitrite: NEGATIVE
Protein, ur: NEGATIVE mg/dL
Specific Gravity, Urine: 1.02 (ref 1.005–1.030)
pH: 5.5 (ref 5.0–8.0)

## 2018-05-27 LAB — SARS CORONAVIRUS 2 BY RT PCR (HOSPITAL ORDER, PERFORMED IN ~~LOC~~ HOSPITAL LAB): SARS Coronavirus 2: NEGATIVE

## 2018-05-27 LAB — LACTIC ACID, PLASMA: Lactic Acid, Venous: 1.7 mmol/L (ref 0.5–1.9)

## 2018-05-27 LAB — GLUCOSE, CAPILLARY: Glucose-Capillary: 102 mg/dL — ABNORMAL HIGH (ref 70–99)

## 2018-05-27 MED ORDER — SODIUM CHLORIDE 0.9 % IV SOLN
2.0000 g | Freq: Once | INTRAVENOUS | Status: AC
  Administered 2018-05-27: 2 g via INTRAVENOUS
  Filled 2018-05-27: qty 2

## 2018-05-27 MED ORDER — ENOXAPARIN SODIUM 30 MG/0.3ML ~~LOC~~ SOLN
30.0000 mg | SUBCUTANEOUS | Status: DC
  Administered 2018-05-28: 30 mg via SUBCUTANEOUS
  Filled 2018-05-27 (×2): qty 0.3

## 2018-05-27 MED ORDER — VANCOMYCIN HCL IN DEXTROSE 750-5 MG/150ML-% IV SOLN
750.0000 mg | INTRAVENOUS | Status: DC
  Filled 2018-05-27: qty 150

## 2018-05-27 MED ORDER — LACTATED RINGERS IV BOLUS (SEPSIS)
1000.0000 mL | Freq: Once | INTRAVENOUS | Status: AC
  Administered 2018-05-27: 1000 mL via INTRAVENOUS

## 2018-05-27 MED ORDER — ACETAMINOPHEN 325 MG PO TABS: 650.0000 mg | ORAL_TABLET | Freq: Four times a day (QID) | ORAL | Status: DC | PRN

## 2018-05-27 MED ORDER — ACETAMINOPHEN 650 MG RE SUPP: 650.0000 mg | Freq: Four times a day (QID) | RECTAL | Status: DC | PRN

## 2018-05-27 MED ORDER — ENOXAPARIN SODIUM 40 MG/0.4ML ~~LOC~~ SOLN: 40.0000 mg | SUBCUTANEOUS | Status: DC

## 2018-05-27 MED ORDER — ORAL CARE MOUTH RINSE
15.0000 mL | Freq: Two times a day (BID) | OROMUCOSAL | Status: DC
  Administered 2018-05-28 – 2018-05-30 (×4): 15 mL via OROMUCOSAL

## 2018-05-27 MED ORDER — SODIUM CHLORIDE 0.9 % IV SOLN
1.0000 g | INTRAVENOUS | Status: DC
  Administered 2018-05-28 – 2018-05-29 (×2): 1 g via INTRAVENOUS
  Filled 2018-05-27 (×2): qty 1

## 2018-05-27 MED ORDER — VANCOMYCIN HCL IN DEXTROSE 1-5 GM/200ML-% IV SOLN
1000.0000 mg | Freq: Once | INTRAVENOUS | Status: AC
  Administered 2018-05-27: 15:00:00 1000 mg via INTRAVENOUS
  Filled 2018-05-27: qty 200

## 2018-05-27 MED ORDER — SODIUM CHLORIDE 0.9 % IV SOLN
INTRAVENOUS | Status: DC
  Administered 2018-05-27 – 2018-05-28 (×2): via INTRAVENOUS

## 2018-05-27 NOTE — H&P (Signed)
[facility] Admission History and Physical FPTS Intern pager: [phone], text pages welcome  Patient name: Kyle York Medical record number: [id number] Date of birth: Jul 10, 1924 Age: 85 y.o. Gender: male  Primary Care Provider: Jefm Petty, MD Consultants: none Code Status: DNR/DNI Preferred Emergency Contact: wife [name]  Chief Complaint: AMS  Assessment and Plan: Kyle York is a 85 y.o. male presenting with AMS and FTT. PMH is significant for alzheimer dementia, hypothyroidism, BPH, HTN, melanoma of eyelid.  AMS/FTT- patient presented to ED after several days of poor PO intake and failure to thrive at memory care unit where he resides. Patient hypothermic with soft BP upon presentation. Sepsis protocol initiated. Patient with bair hugger applied but worsening hypothermia. No obvious source for infection. Patient not requiring any oxygen. No leukocytosis or lymphopenia. No elevations in AST/ALT. No report of cough or fever. Patient resides at memory care unit [facility]. There have been positive COVID tests at the SNF portion of [facility], in a separate building. It would be prudent to r/o covid in this patient. Creatinine appears to be around baseline at 1.5. LA 1.7. UA negative nitrites. CXR with small left pleural effusion- question pneumonia as source. No skin breakdown as source noted on exam. -admit to med-surg, attending Dr. Andria Frames -vitals per floor -follow culture data -continue vanc and cefepime for presumed sepsis of unknown source -may need soft restraints to prevent patient from pulling out IV -IVMF at 75 cc/hr, bolus as needed -c/s to palliative care -obtain COVID testing, contact and droplet precautions   Alzheimer disease with behavioral disturbance- patient resides at memory care unit. Progressive dementia may be reason for above presentation.  -may need to order risperdal as needed for  agitation/aggression -consult to palliative care  Hypothyroidism- synthroid on home med list -hold home meds  BPH- finasteride on med list -hold home meds  Depression/anxiety- on remeron, zoloft, risperidone, trazodone, xanax per home med list -hold home psych meds  HTN- lisinopril on home med list -hold home meds  FEN/GI: NPO while altered Prophylaxis: lovenox  Disposition: med-surg  History of Present Illness:  Kyle York is a 85 y.o. male presenting with several days of failure to thrive at memory care unit where he resides. He is unable to provide history. He is non-verbal at baseline. Family has not seen him in some time due to coronavirus pandemic. There have been cases at his nursing facility.   Review Of Systems: Per HPI with the following additions:   Review of Systems  Unable to perform ROS: Mental status change    Patient Active Problem List   Diagnosis Date Noted  . Sepsis ([place]) 05/27/2018  . Cancer ([place]) 05/14/2011  . Alzheimer disease ([place]) 04/13/2010    Past Medical History: Past Medical History:  Diagnosis Date  . Alzheimer disease [facility]) march 2012  . 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 Celiac disease   . Melanoma ([place]) 01/2008   left shoulder/    Past Surgical History: Past Surgical History:  Procedure Laterality Date  . LEG SURGERY    . MELANOMA EXCISION  03/16/2008   left posterior shoulder  . 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 Smoking status: Never Smoker  . Smokeless tobacco: Never Used  Substance Use Topics  . Alcohol use: No  . Drug use:  No   Additional social history: resides at memory care unit  Please also refer to relevant sections of EMR.  Family History: History reviewed. No pertinent family history.  Allergies and Medications: Allergies  Allergen Reactions  .  Gluten Meal Other (See Comments)    Per MAR   No current facility-administered medications on file prior to encounter.    Current Outpatient Medications on File Prior to Encounter  Medication Sig Dispense Refill  . ALPRAZolam (XANAX) 0.5 MG tablet Take 0.5 mg by mouth 2 (two) times daily.    Marland Kitchen ALPRAZolam (XANAX) 0.5 MG tablet Take 0.5 mg by mouth daily as needed for anxiety.    . divalproex (DEPAKOTE SPRINKLE) 125 MG capsule Take 250 mg by mouth 2 (two) times daily.    . finasteride (PROSCAR) 5 MG tablet Take 5 mg by mouth daily.    . folic acid (FOLVITE) 1 MG tablet Take 1 mg by mouth daily.    Marland Kitchen galantamine (RAZADYNE ER) 24 MG 24 hr capsule Take 24 mg by mouth daily.    Marland Kitchen levothyroxine (SYNTHROID, LEVOTHROID) 50 MCG tablet Take 50 mcg by mouth daily.    Marland Kitchen lisinopril (PRINIVIL,ZESTRIL) 10 MG tablet Take 10 mg by mouth daily.    . memantine (NAMENDA XR) 28 MG CP24 24 hr capsule Take 28 mg by mouth daily.    . mirtazapine (REMERON) 7.5 MG tablet Take 7.5 mg by mouth at bedtime.    . modafinil (PROVIGIL) 100 MG tablet Take 100 mg by mouth daily.    . mupirocin ointment (BACTROBAN) 2 % Apply 1 application topically 2 (two) times daily. Spread topically to left ear scratch twice a day until healed    . risperiDONE (RISPERDAL) 0.5 MG tablet Take 0.75 mg by mouth 2 (two) times daily.    . sertraline (ZOLOFT) 50 MG tablet Take 50 mg by mouth every morning.    . traZODone (DESYREL) 50 MG tablet Take 50 mg by mouth at bedtime.    . vitamin B-12 (CYANOCOBALAMIN) 1000 MCG tablet Take 1,000 mcg by mouth daily.    . hydrocortisone 2.5 % lotion Apply topically 2 (two) times daily. (Patient not taking: Reported on 05/27/2018) 59 mL 0    Objective: BP 134/72   Pulse 68   Temp (!) 91 F (32.8 C) (Rectal)   Resp 13   Ht 5\' 4"  (1.626 m)   Wt 61.2 kg   SpO2 99%   BMI 23.17 kg/m  Exam: General: elderly frail gentleman in mild distress Eyes: pupils miotic, equal, minimally reactive ENTM: MMM Neck:  supple Cardiovascular: RRR Respiratory: coarse breath sounds in all lung fields Gastrointestinal: guarding, soft MSK: moves all extremities equally Derm: no obvious lesions or rashes appreciated, no skin breakdown noted Neuro: does not follow commands Psych: agitated  Labs and Imaging: CBC BMET  Recent Labs  Lab 05/27/18 1507  WBC 6.8  HGB 11.0*  HCT 36.5*  PLT 109*   Recent Labs  Lab 05/27/18 1507  NA 146*  K 4.2  CL 108  CO2 29  BUN 28*  CREATININE 1.57*  GLUCOSE 77  CALCIUM 9.0     Dg Chest Portable 1 View  Result Date: 05/27/2018 CLINICAL DATA:  Altered mental status EXAM: PORTABLE CHEST 1 VIEW COMPARISON:  August 02, 2017 FINDINGS: There is a small left pleural effusion with atelectatic change in the lateral left base. Lungs elsewhere are clear. The heart size and pulmonary vascularity are normal. No adenopathy. There is aortic atherosclerosis. There is  calcification in the left carotid artery. There is degenerative change in the thoracic spine. IMPRESSION: Small left pleural effusion with lateral left base atelectasis. Lungs elsewhere clear. Heart size normal. No adenopathy. There is left carotid artery calcification. Aortic Atherosclerosis (ICD10-I70.0). Electronically Signed   By: Lowella Grip III M.D.   On: 05/27/2018 15:04     Steve Rattler, DO 05/27/2018, 6:18 PM PGY-3, [place] Intern pager: [phone], text pages welcome

## 2018-05-27 NOTE — ED Notes (Addendum)
Pt placed on bair hugger. RN attempted BP on leg, pt became increasingly agitated. BP switched back to arm. Pt tensing up and attempting to hit RN as BP is being taken, interfering with BP result.

## 2018-05-27 NOTE — Progress Notes (Signed)
Extremely agitated when being touched for nursing care.  Kicks and squeezes.  When not touching patient he is calm.  RN does not see need for restraints at this time therefore no restraints in use.  Retail banker in place.  Patient does not verbally respond unless hollering with care, otherwise quiet and calm.

## 2018-05-27 NOTE — ED Notes (Signed)
Bair hugger reapplied  

## 2018-05-27 NOTE — Progress Notes (Signed)
Pharmacy Antibiotic Note  Kyle York is a 85 y.o. male admitted on 05/27/2018 with FTT. Pharmacy has been consulted for empiric abx. SCr 1.5, eCrCl 25 ml/min. Cultures drawn.   Plan: -Cefepime 1 g IV q24h -Vancomycin 750 mg IV q24h -Monitor cultures, renal fx, vancomycin levels as needed   Height: 5\' 4"  (162.6 cm) Weight: 135 lb (61.2 kg) IBW/kg (Calculated) : 59.2  Temp (24hrs), Avg:94.3 F (34.6 C), Min:92.6 F (33.7 C), Max:96 F (35.6 C)  Recent Labs  Lab 05/27/18 1507  WBC 6.8  CREATININE 1.57*  LATICACIDVEN 1.7     Antimicrobials this admission: 4/14 vancomycin > 4/14 cefepime >  Dose adjustments this admission: N/A  Microbiology results: 4/14 urine cx: 4/14 blood cx:   Kyle York 05/27/2018 4:56 PM

## 2018-05-27 NOTE — Progress Notes (Signed)
Called and spoke with Kyle Fillers, MD intern and discussed restraint and explained that patient has been calm and still unless being touched for patient care and then becomes agitated and that restraints are not in place at this time.  Also asked about continuous pulse ox and need for CBG.  MD stated to place restraint to keep patient from pulling out IVs and that the need can be reassessed tomorrow.  MD stated he would d/c continuous pulse ox and order qshift cbg.

## 2018-05-27 NOTE — ED Triage Notes (Addendum)
Pt arrives via [facility] from [facility] memory care unit. Per staff pt has not eaten in three days and has been weaker, but is still mentally at baseline. EMS denies fever or cough. Pt is DNR with paperwork at bedside. Temporal temperature with EMS 97, 96 with ED RN. Pt uncooperative for oral or axillary temp. Warm blanket given.

## 2018-05-27 NOTE — ED Provider Notes (Signed)
Assumed care from Dr. Venora Maples.  Patient has had a failure to thrive decline at memory care facility.  No real specific symptoms have been identified.  Patient has advanced Alzheimer's dementia.  Patient's wife first had discussed the case with Dr. Venora Maples.  She advises DO NOT INTUBATE and DO NOT RESUSCITATE.  She does wish for supportive care for infections or other nonheroic treatments.  Diagnostic results are grossly within normal limits.  Patient however remains significantly hypothermic.  He has been a Customer service manager.  Nurses have rechecked temp and has actually gone down slightly.  Patient's treatment started with sepsis protocol.  I did recontact the patient's wife to update her on the patient's condition and diagnostic results.  She is in agreement with plan of admission for treatment of hypothermia and suspected sepsis.  Did clarify with her for potential coronavirus exposure.  Advises that the patient stays in a memory care building where there have not been positive coronavirus cases that she is aware of.  She reports that 2 people in the assisted living building which is separate did have coronavirus but have recovered. Physical Exam  BP 102/66   Pulse 68   Temp (!) 91 F (32.8 C) (Rectal)   Resp 17   Ht 5\' 4"  (1.626 m)   Wt 61.2 kg   SpO2 98%   BMI 23.17 kg/m   Physical Exam  ED Course/Procedures     Procedures  MDM  Plan for admission for persistent hypothermia and blood pressures slightly decreased.  Suspicion for sepsis.   DNR and do not intubate. Supportive and comfort care.     Charlesetta Shanks, MD 05/27/18 1756

## 2018-05-27 NOTE — ED Provider Notes (Signed)
[facility] EMERGENCY DEPARTMENT Provider Note   CSN: [id number] Arrival date & time: 05/27/18  1335    History   Chief Complaint Chief Complaint  Patient presents with   Fatigue   Level 5 caveat: Dementia   HPI Jonavon Trieu is a 85 y.o. male.     HPI Patient is a 85 year old male who presents to the emergency department with reported decreased oral intake over the past several days and "declining" per the nursing home.  Patient is a DO NOT RESUSCITATE DO NOT INTUBATE.  He has Alzheimer's dementia.  I spoke with the patient's wife who reiterates his DNR status.  She does think you benefit from IV fluids and work-up for why he feels so weak.  Patient is noncommunicative at baseline.  Rectal temp 92 degrees.  Reportedly there are several COVID-19 patients at his nursing facility.  Family has been unable to visit him in the past several weeks.  They do not know any additional information.      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 Authorizing Provider  aspirin 81 MG tablet Take 81 mg by mouth daily.    [provider]  calcium carbonate (OS-CAL) 600 MG TABS Take 600 mg by mouth daily.    [provider]  Cyanocobalamin (VITAMIN B 12 PO) Take by mouth daily.    [provider]  Dietary Management  Product (AXONA) packet Take 40 g by mouth daily.    [provider]  galantamine (RAZADYNE ER) 24 MG 24 hr capsule Take 24 mg by mouth daily with breakfast.    [provider]  hydrocortisone 2.5 % lotion Apply topically 2 (two) times daily. 08/27/14   Muthersbaugh, Jarrett Soho, PA-C  iron polysaccharides (NIFEREX) 150 MG capsule Take 150 mg by mouth daily.    [provider]  lisinopril (PRINIVIL,ZESTRIL) 10 MG tablet Take 10 mg by mouth daily. 03/13/12   [provider]  memantine (NAMENDA) 10 MG tablet Take by mouth daily. 28 mg daily    [provider]  Multiple Vitamin (MULTIVITAMIN) tablet Take 1 tablet by mouth daily. With iron    [provider]  vitamin E 1000 UNIT capsule Take 1,000 Units by mouth 2 (two) times daily.    [provider]    Family History History reviewed. No pertinent family history.  Social History Social History   Tobacco Use   Smoking status: Never Smoker   Smokeless tobacco: Never Used  Substance Use Topics   Alcohol use: No   Drug use: No     Allergies   Patient has no known allergies.   Review of Systems Review of Systems  Unable to perform ROS: Dementia     Physical Exam Updated Vital Signs BP Marland Kitchen)  161/89    Pulse 72    Temp (!) 92.6 F (33.7 C) (Rectal)    Resp 18    Ht 5\' 4"  (1.626 m)    Wt 61.2 kg    SpO2 98%    BMI 23.17 kg/m   Physical Exam Vitals signs and nursing note reviewed.  Constitutional:      Appearance: He is well-developed.  HENT:     Head: Normocephalic and atraumatic.  Neck:     Musculoskeletal: Normal range of motion.  Cardiovascular:     Rate and Rhythm: Normal rate and regular rhythm.     Heart sounds: Normal heart sounds.  Pulmonary:     Effort: Pulmonary effort is normal. No respiratory distress.     Breath sounds: Normal breath sounds.  Abdominal:     General: There is no distension.     Palpations: Abdomen is soft.     Tenderness: There is no  abdominal tenderness.  Musculoskeletal: Normal range of motion.  Skin:    General: Skin is warm and dry.  Neurological:     Mental Status: He is alert.     Comments: Moves all 4 extremities.  Slightly contracted in the legs  Psychiatric:        Judgment: Judgment normal.      ED Treatments / Results  Labs (all labs ordered are listed, but only abnormal results are displayed) Labs Reviewed  URINALYSIS, ROUTINE W REFLEX MICROSCOPIC - Abnormal; Notable for the following components:      Result Value   Ketones, ur 15 (*)    All other components within normal limits  CULTURE, BLOOD (ROUTINE X 2)  CULTURE, BLOOD (ROUTINE X 2)  URINE CULTURE  LACTIC ACID, PLASMA  LACTIC ACID, PLASMA  COMPREHENSIVE METABOLIC PANEL  CBC WITH DIFFERENTIAL/PLATELET    EKG EKG Interpretation  Date/Time:  Tuesday May 27 2018 13:51:34 EDT Ventricular Rate:  73 PR Interval:    QRS Duration: 92 QT Interval:  418 QTC Calculation: 461 R Axis:   53 Text Interpretation:  Sinus rhythm Nonspecific repol abnormality, inferior leads nonspecific changes as compared to prior ecg in 2015 Confirmed by Jola Schmidt [phone]) on 05/27/2018 4:08:59 PM   Radiology Dg Chest Portable 1 View  Result Date: 05/27/2018 CLINICAL DATA:  Altered mental status EXAM: PORTABLE CHEST 1 VIEW COMPARISON:  August 02, 2017 FINDINGS: There is a small left pleural effusion with atelectatic change in the lateral left base. Lungs elsewhere are clear. The heart size and pulmonary vascularity are normal. No adenopathy. There is aortic atherosclerosis. There is calcification in the left carotid artery. There is degenerative change in the thoracic spine. IMPRESSION: Small left pleural effusion with lateral left base atelectasis. Lungs elsewhere clear. Heart size normal. No adenopathy. There is left carotid artery calcification. Aortic Atherosclerosis (ICD10-I70.0). Electronically Signed   By: Lowella Grip III M.D.   On: 05/27/2018 15:04     Procedures .Critical Care Performed by: Jola Schmidt, MD Authorized by: Jola Schmidt, MD   Critical care provider statement:    Critical care time (minutes):  33   Critical care was time spent personally by me on the following activities:  Discussions with consultants, evaluation of patient's response to treatment, examination of patient, ordering and performing treatments and interventions, ordering and review of laboratory studies, ordering and review of radiographic studies, pulse oximetry, re-evaluation of patient's condition, obtaining history from patient or surrogate and review of old charts   (including critical care time)  Medications Ordered in  ED Medications  lactated ringers bolus 1,000 mL (has no administration in time range)    And  lactated ringers bolus 1,000 mL (has no administration in time range)  ceFEPIme (MAXIPIME) 2 g in sodium chloride 0.9 % 100 mL IVPB (has no administration in time range)  vancomycin (VANCOCIN) IVPB 1000 mg/200 mL premix (has no administration in time range)     Initial Impression / Assessment and Plan / ED Course  I have reviewed the triage vital signs and the nursing notes.  Pertinent labs & imaging results that were available during my care of the patient were reviewed by me and considered in my medical decision making (see chart for details).        Dry appearing.  IV fluids now.  Final Clinical Impressions(s) / ED Diagnoses   Final diagnoses:  None    ED Discharge Orders    None       Jola Schmidt, MD 05/27/18 [phone]

## 2018-05-27 NOTE — ED Notes (Addendum)
ED TO INPATIENT HANDOFF REPORT  ED Nurse Name and Phone #: [phone] Crocker  S Name/Age/Gender Kyle York 85 y.o. male Room/Bed: 024C/024C  Code Status   Code Status: DNR  Home/SNF/Other Nursing Home Patient oriented to: N/A Is this baseline? Yes   Triage Complete: Triage complete  Chief Complaint weakness/not eating X3 days  Triage Note Pt [facility]. Per staff pt has not eaten in three days and has been weaker, but is still mentally at baseline. EMS denies fever or cough. Pt is DNR with paperwork at bedside. Temporal temperature with EMS 97, 96 with ED RN. Pt uncooperative for oral or axillary temp. Warm blanket given.   Allergies Allergies  Allergen Reactions  . Gluten Meal Other (See Comments)    Per MAR    Level of Care/Admitting Diagnosis ED Disposition    ED Disposition Condition [facility] Area: [place] [[id number]]  Level of Care: Med-Surg [16]  Diagnosis: Sepsis [facility]) [[id number]]  Admitting Physician: Zenia Resides [[id number]]  Attending Physician: Zenia Resides [[id number]]  Estimated length of stay: 3 - 4 days  Certification:: I certify this patient will need inpatient services for at least 2 midnights  Possible Covid Disease Patient Isolation: Low Risk  (Less than 4L [place] supplementation)  PT Class (Do Not Modify): Inpatient [101]  PT Acc Code (Do Not Modify): Private [1]       B Medical/Surgery History Past Medical History:  Diagnosis Date  . Alzheimer disease [facility]) march 2012  . 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 Celiac disease   . Melanoma ([place]) 01/2008   left shoulder/   Past Surgical History:  Procedure Laterality Date  . LEG SURGERY    . MELANOMA EXCISION  03/16/2008   left posterior shoulder  . shave bx  05/14/2011   right lower eyelid=melanoma in situ,lentigo malignant type     A IV  Location/Drains/Wounds Patient Lines/Drains/Airways Status   Active Line/Drains/Airways    Name:   Placement date:   Placement time:   Site:   Days:   Peripheral IV 05/27/18 Right Hand   05/27/18    1508    Hand   less than 1   Peripheral IV 05/27/18 Right;Upper Forearm   05/27/18    1509    Forearm   less than 1          Intake/Output Last 24 hours  Intake/Output Summary (Last 24 hours) at 05/27/2018 1910 Last data filed at 05/27/2018 1631 Gross per 24 hour  Intake 1300 ml  Output -  Net 1300 ml    Labs/Imaging Results for orders placed or performed during the hospital encounter of 05/27/18 (from the past 48 hour(s))  Urinalysis, Routine w reflex microscopic     Status: Abnormal   Collection Time: 05/27/18  3:00 PM  Result Value Ref Range   Color, Urine YELLOW YELLOW   APPearance CLEAR CLEAR   Specific Gravity, Urine 1.020 1.005 - 1.030   pH 5.5 5.0 - 8.0   Glucose, UA NEGATIVE NEGATIVE mg/dL   Hgb urine dipstick NEGATIVE NEGATIVE   Bilirubin Urine NEGATIVE NEGATIVE   Ketones, ur 15 (A) NEGATIVE mg/dL   Protein, ur NEGATIVE NEGATIVE mg/dL   Nitrite NEGATIVE NEGATIVE   Leukocytes,Ua NEGATIVE NEGATIVE    Comment: Microscopic not done on urines with negative protein, blood, leukocytes, nitrite, or glucose < 500 mg/dL. Performed at  [facility], [place] [address], [place], [place] [address]   Lactic acid, plasma     Status: None   Collection Time: 05/27/18  3:07 PM  Result Value Ref Range   Lactic Acid, Venous 1.7 0.5 - 1.9 mmol/L    Comment: Performed at [facility] [address] [place], [place] [address]  Comprehensive metabolic panel     Status: Abnormal   Collection Time: 05/27/18  3:07 PM  Result Value Ref Range   Sodium 146 (H) 135 - 145 mmol/L   Potassium 4.2 3.5 - 5.1 mmol/L   Chloride 108 98 - 111 mmol/L   CO2 29 22 - 32 mmol/L   Glucose, Bld 77 70 - 99 mg/dL   BUN 28 (H) 8 - 23 mg/dL   Creatinine, Ser 1.57 (H) 0.61 - 1.24 mg/dL   Calcium 9.0  8.9 - 10.3 mg/dL   Total Protein 5.4 (L) 6.5 - 8.1 g/dL   Albumin 3.0 (L) 3.5 - 5.0 g/dL   AST 18 15 - 41 U/L   ALT 29 0 - 44 U/L   Alkaline Phosphatase 76 38 - 126 U/L   Total Bilirubin 0.4 0.3 - 1.2 mg/dL   GFR calc non Af Amer 38 (L) >60 mL/min   GFR calc Af Amer 44 (L) >60 mL/min   Anion gap 9 5 - 15    Comment: Performed at [facility] [address] [place], [place] [address]  CBC WITH DIFFERENTIAL     Status: Abnormal   Collection Time: 05/27/18  3:07 PM  Result Value Ref Range   WBC 6.8 4.0 - 10.5 K/uL   RBC 3.89 (L) 4.22 - 5.81 MIL/uL   Hemoglobin 11.0 (L) 13.0 - 17.0 g/dL   HCT 36.5 (L) 39.0 - 52.0 %   MCV 93.8 80.0 - 100.0 fL   MCH 28.3 26.0 - 34.0 pg   MCHC 30.1 30.0 - 36.0 g/dL   RDW 14.8 11.5 - 15.5 %   Platelets 109 (L) 150 - 400 K/uL    Comment: REPEATED TO VERIFY PLATELET COUNT CONFIRMED BY SMEAR SPECIMEN CHECKED FOR CLOTS Immature Platelet Fraction may be clinically indicated, consider ordering this additional test [id number]    nRBC 0.4 (H) 0.0 - 0.2 %   Neutrophils Relative % 82 %   Neutro Abs 5.6 1.7 - 7.7 K/uL   Lymphocytes Relative 9 %   Lymphs Abs 0.6 (L) 0.7 - 4.0 K/uL   Monocytes Relative 7 %   Monocytes Absolute 0.5 0.1 - 1.0 K/uL   Eosinophils Relative 1 %   Eosinophils Absolute 0.1 0.0 - 0.5 K/uL   Basophils Relative 0 %   Basophils Absolute 0.0 0.0 - 0.1 K/uL   Immature Granulocytes 1 %   Abs Immature Granulocytes 0.06 0.00 - 0.07 K/uL    Comment: Performed at [facility], [address], [place], [place] [address]   Dg Chest Portable 1 View  Result Date: 05/27/2018 CLINICAL DATA:  Altered mental status EXAM: PORTABLE CHEST 1 VIEW COMPARISON:  August 02, 2017 FINDINGS: There is a small left pleural effusion with atelectatic change in the lateral left base. Lungs elsewhere are clear. The heart size and pulmonary vascularity are normal. No adenopathy. There is aortic atherosclerosis. There is calcification in the left carotid  artery. There is degenerative change in the thoracic spine. IMPRESSION: Small left pleural effusion with lateral left base atelectasis. Lungs elsewhere clear. Heart size normal. No adenopathy. There is  left carotid artery calcification. Aortic Atherosclerosis (ICD10-I70.0). Electronically Signed   By: Lowella Grip III M.D.   On: 05/27/2018 15:04    Pending Labs Unresulted Labs (From admission, onward)    Start     Ordered   06/03/18 0500  Creatinine, serum  (enoxaparin (LOVENOX)    CrCl >/= 30 ml/min)  Weekly,   R    Comments:  while on enoxaparin therapy    05/27/18 1909   05/28/18 9323  Basic metabolic panel  Tomorrow morning,   R     05/27/18 1909   05/27/18 1907  SARS Coronavirus 2 [facility] order, Performed in [facility] hospital lab)  (Novel Coronavirus, [facility] Order) with precautions panel)  Once,   R     05/27/18 1906   05/27/18 1434  Urine culture  ONCE - STAT,   STAT     05/27/18 1433   05/27/18 1432  Lactic acid, plasma  Now then every 2 hours,   STAT     05/27/18 1432   05/27/18 1432  Blood Culture (routine x 2)  BLOOD CULTURE X 2,   STAT     05/27/18 1432          Vitals/Pain Today's Vitals   05/27/18 1724 05/27/18 1730 05/27/18 1738 05/27/18 1901  BP: 134/72   125/71  Pulse: 68 62  69  Resp: 13 15  18   Temp:   (!) 91 F (32.8 C)   TempSrc:   Rectal   SpO2: 99% 98%  97%  Weight:      Height:        Isolation Precautions Contact precautions  Medications Medications  ceFEPIme (MAXIPIME) 1 g in sodium chloride 0.9 % 100 mL IVPB (has no administration in time range)  vancomycin (VANCOCIN) IVPB 750 mg/150 ml premix (has no administration in time range)  enoxaparin (LOVENOX) injection 40 mg (has no administration in time range)  0.9 %  sodium chloride infusion (has no administration in time range)  acetaminophen (TYLENOL) tablet 650 mg (has no administration in time range)    Or  acetaminophen (TYLENOL) suppository 650 mg (has no administration in  time range)  lactated ringers bolus 1,000 mL (0 mLs Intravenous Stopped 05/27/18 1631)    And  lactated ringers bolus 1,000 mL (1,000 mLs Intravenous New Bag/Given 05/27/18 1520)  ceFEPIme (MAXIPIME) 2 g in sodium chloride 0.9 % 100 mL IVPB (0 g Intravenous Stopped 05/27/18 1617)  vancomycin (VANCOCIN) IVPB 1000 mg/200 mL premix (0 mg Intravenous Stopped 05/27/18 1624)    Mobility manual wheelchair     Focused Assessments     R Recommendations: See Admitting Provider Note  Report given to:   Additional Notes: Pt from [facility], pt has dementia and is at baseline mentally per staff, but has seemed more weak/fatigued than normal. Pt is DNR, paperwork at bedside. Pt has 22 gauge in right hand and 22 gauge in right upper forearm. Pt received rocephin, vancomycin and 2L of LR. Pt to be tested for covid.

## 2018-05-27 NOTE — Progress Notes (Signed)
Called and spoke with patient's wife Kyle York and updated her on patient's care and gave her 2W nursing station phone number.  Wife appreciative.

## 2018-05-27 NOTE — ED Notes (Signed)
Attempted report 

## 2018-05-28 DIAGNOSIS — Z7189 Other specified counseling: Secondary | ICD-10-CM

## 2018-05-28 DIAGNOSIS — F0391 Unspecified dementia with behavioral disturbance: Secondary | ICD-10-CM

## 2018-05-28 DIAGNOSIS — R6520 Severe sepsis without septic shock: Secondary | ICD-10-CM

## 2018-05-28 DIAGNOSIS — F03918 Unspecified dementia, unspecified severity, with other behavioral disturbance: Secondary | ICD-10-CM

## 2018-05-28 DIAGNOSIS — G309 Alzheimer's disease, unspecified: Secondary | ICD-10-CM

## 2018-05-28 DIAGNOSIS — F0281 Dementia in other diseases classified elsewhere with behavioral disturbance: Secondary | ICD-10-CM

## 2018-05-28 DIAGNOSIS — A419 Sepsis, unspecified organism: Principal | ICD-10-CM

## 2018-05-28 DIAGNOSIS — J690 Pneumonitis due to inhalation of food and vomit: Secondary | ICD-10-CM

## 2018-05-28 DIAGNOSIS — Z515 Encounter for palliative care: Secondary | ICD-10-CM

## 2018-05-28 DIAGNOSIS — E860 Dehydration: Secondary | ICD-10-CM

## 2018-05-28 LAB — GLUCOSE, CAPILLARY
Glucose-Capillary: 98 mg/dL (ref 70–99)
Glucose-Capillary: 102 mg/dL — ABNORMAL HIGH (ref 70–99)
Glucose-Capillary: 80 mg/dL (ref 70–99)
Glucose-Capillary: 69 mg/dL — ABNORMAL LOW (ref 70–99)
Glucose-Capillary: 54 mg/dL — ABNORMAL LOW (ref 70–99)
Glucose-Capillary: 60 mg/dL — ABNORMAL LOW (ref 70–99)

## 2018-05-28 LAB — BASIC METABOLIC PANEL
Anion gap: 11 (ref 5–15)
BUN: 25 mg/dL — ABNORMAL HIGH (ref 8–23)
CO2: 25 mmol/L (ref 22–32)
Calcium: 8.6 mg/dL — ABNORMAL LOW (ref 8.9–10.3)
Chloride: 110 mmol/L (ref 98–111)
Creatinine, Ser: 1.38 mg/dL — ABNORMAL HIGH (ref 0.61–1.24)
GFR calc Af Amer: 51 mL/min — ABNORMAL LOW (ref >60)
GFR calc non Af Amer: 44 mL/min — ABNORMAL LOW (ref >60)
Glucose, Bld: 68 mg/dL — ABNORMAL LOW (ref 70–99)
Potassium: 4.8 mmol/L (ref 3.5–5.1)
Sodium: 146 mmol/L — ABNORMAL HIGH (ref 135–145)

## 2018-05-28 LAB — URINE CULTURE: Culture: NO GROWTH

## 2018-05-28 LAB — MRSA PCR SCREENING: MRSA by PCR: NEGATIVE

## 2018-05-28 MED ORDER — ALPRAZOLAM 0.25 MG PO TABS
0.2500 mg | ORAL_TABLET | Freq: Two times a day (BID) | ORAL | Status: DC
  Administered 2018-05-28: 0.25 mg via ORAL
  Filled 2018-05-28 (×4): qty 1

## 2018-05-28 MED ORDER — SODIUM CHLORIDE 0.9 % IV BOLUS
1000.0000 mL | Freq: Once | INTRAVENOUS | Status: AC
  Administered 2018-05-28: 1000 mL via INTRAVENOUS

## 2018-05-28 MED ORDER — DEXTROSE 50 % IV SOLN
12.5000 g | INTRAVENOUS | Status: AC
  Administered 2018-05-28: 12.5 g via INTRAVENOUS
  Filled 2018-05-28: qty 50

## 2018-05-28 NOTE — Consult Note (Signed)
Consultation Note Date: 05/28/2018   Patient Name: Kyle York  DOB: August 04, 1924  MRN: [id number]  Age / Sex: 85 y.o., male  PCP: Kyle Petty, MD Referring Physician: Zenia Resides, MD  Reason for Consultation: Establishing goals of care  HPI/Patient Profile: 85 y.o. male  with past medical history of Alzheimer's dementia,  admitted on 05/27/2018 with three day history of not eating drinking, progressive weakness. Workup revealed hypothermia, mild hypotension, mild pleural effusion, some agitation- suspicious for sepsis- no de facto infectious etiology found to date. COVID-19 test returned negative, but there were COVID positive patients at his facility. Palliative medicine consulted for Kyle York.   Clinical Assessment and Goals of Care: I evaluated patient at bedside. He would not open eyes to my touch or loud voice. His brow was furrowed. No purposeful movements.   I have reviewed medical records including EPIC notes, labs and imaging, assessed the patient and then spoke with patient's spouse- Kyle York, and son- Kyle York via phone- to discuss diagnosis prognosis, [place], EOL wishes, disposition and options.  I introduced Palliative Medicine as specialized medical care for people living with serious illness. It focuses on providing relief from the symptoms and stress of a serious illness. The goal is to improve quality of life for both the patient and the family.  We discussed a brief life review of the patient. 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 He has been living at [facility] for [facility] months. His family has not seen him for the last month due to visitor restrictions due to COVID-19.  As far as functional and nutritional status- he is nonverbal at baseline. Wheelchair bound- when they last saw him he would sometimes feed himself "with his hands", but he typically required hand feeding  for all meals. He was dependent for all ADL's. They note he was sleeping more than awake. In fact patient's son- Kyle York states that "it does not surprise me that he won't wake up".   We discussed her current illness and what it means in the larger context of her on-going co-morbidities.  Natural disease trajectory and expectations at EOL were discussed. We discussed the natural trajectory of dementia. I discussed with Kyle York and Kyle York my concerns that patient is possibly dying from end stage dementia due to noted changes in three areas- cognition, nutrition and function.   The difference between aggressive medical intervention and comfort care was considered in light of the patient's goals of care. Kyle York and Kyle Noble do not feel patient has had a good quality of life over the last few months.   Advanced directives, concepts specific to code status, artifical feeding and hydration, and rehospitalization were considered and discussed. Patient is currently DNR. Family would like to continue trial of IV fluids and antibiotics for the next day and re-evaluate patient tomorrow morning. They are considering transition to full comfort measures.  Hospice and Palliative Care services outpatient were explained and offered.I recommended to family that patient be transferred to residential Hospice if they prefer to  transition to comfort measures only and support patient through natural dying process.   Questions and concerns were addressed.     Primary Decision Maker NEXT OF KIN- spouse- Kyle York    SUMMARY OF RECOMMENDATIONS   -Continue IV fluids and antibiotics and other conservative measures overnight -PMT will touch base with family tomorrow and continue GOC- if no change in patient status- anticipate transition to comfort measures only  Code Status/Advance Care Planning:  DNR  Prognosis:    < 2 weeks due to advanced dementia, patient not eating or drinking  Discharge Planning: To Be  Determined  Primary Diagnoses: Present on Admission:  Sepsis ([facility])   I have reviewed the medical record, interviewed the patient and family, and examined the patient. The following aspects are pertinent.  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,lentigo maligna type   Celiac disease    Melanoma ([place]) 01/2008   left shoulder/   Social History   Socioeconomic History   Marital status: Married    Spouse name: Not on file   Number of children: Not on file   Years of education: Not on file   Highest education level: Not on file  Occupational History   Not on file  Social Needs   Financial resource strain: Not on file   Food insecurity:    Worry: Not on file    Inability: Not on file   Transportation needs:    Medical: Not on file    Non-medical: Not on file  Tobacco Use   Smoking status: Never Smoker   Smokeless tobacco: Never Used  Substance and Sexual Activity   Alcohol use: No   Drug use: No   Sexual activity: Not on file  Lifestyle   Physical activity:    Days per week: Not on file    Minutes per session: Not on file   Stress: Not on file  Relationships   Social connections:    Talks on phone: Not on file    Gets together: Not on file    Attends religious service: Not on file    Active member of club or organization: Not on file    Attends meetings of clubs or organizations: Not on file    Relationship status: Not on file  Other Topics Concern   Not on file  Social History Narrative   Not on file   History reviewed. No pertinent family history. Scheduled Meds:  ALPRAZolam  0.25 mg Oral BID   enoxaparin (LOVENOX) injection  30 mg Subcutaneous Q24H   mouth rinse  15 mL Mouth Rinse BID   Continuous Infusions:  sodium chloride 75 mL/hr at 05/28/18 0845   ceFEPime (MAXIPIME) IV     PRN Meds:.acetaminophen **OR**  acetaminophen Medications Prior to Admission:  Prior to Admission medications   Medication Sig Start Date End Date Taking? Authorizing Provider  ALPRAZolam Duanne Moron) 0.5 MG tablet Take 0.5 mg by mouth 2 (two) times daily.   Yes [provider]  ALPRAZolam Duanne Moron) 0.5 MG tablet Take 0.5 mg by mouth daily as needed for anxiety.   Yes [provider]  divalproex (DEPAKOTE SPRINKLE) 125 MG capsule Take 250 mg by mouth 2 (two) times daily.   Yes [provider]  finasteride (PROSCAR) 5 MG tablet Take 5 mg by mouth daily.   Yes [provider]  folic acid (FOLVITE) 1 MG tablet Take 1  mg by mouth daily.   Yes [provider]  galantamine (RAZADYNE ER) 24 MG 24 hr capsule Take 24 mg by mouth daily.   Yes [provider]  levothyroxine (SYNTHROID, LEVOTHROID) 50 MCG tablet Take 50 mcg by mouth daily.   Yes [provider]  lisinopril (PRINIVIL,ZESTRIL) 10 MG tablet Take 10 mg by mouth daily.   Yes [provider]  memantine (NAMENDA XR) 28 MG CP24 24 hr capsule Take 28 mg by mouth daily.   Yes [provider]  mirtazapine (REMERON) 7.5 MG tablet Take 7.5 mg by mouth at bedtime.   Yes [provider]  modafinil (PROVIGIL) 100 MG tablet Take 100 mg by mouth daily.   Yes [provider]  mupirocin ointment (BACTROBAN) 2 % Apply 1 application topically 2 (two) times daily. Spread topically to left ear scratch twice a day until healed   Yes [provider]  risperiDONE (RISPERDAL) 0.5 MG tablet Take 0.75 mg by mouth 2 (two) times daily.   Yes [provider]  sertraline (ZOLOFT) 50 MG tablet Take 50 mg by mouth every morning.   Yes [provider]  traZODone (DESYREL) 50 MG tablet Take 50 mg by mouth at bedtime.   Yes [provider]  vitamin B-12 (CYANOCOBALAMIN) 1000 MCG tablet Take 1,000 mcg by mouth daily.   Yes [provider]  hydrocortisone 2.5 % lotion Apply  topically 2 (two) times daily. Patient not taking: Reported on 05/27/2018 08/27/14   Muthersbaugh, Jarrett Soho, PA-C   Allergies  Allergen Reactions   Gluten Meal Other (See Comments)    Per MAR   Review of Systems  Unable to perform ROS: Acuity of condition    Physical Exam Vitals signs and nursing note reviewed.  HENT:     Mouth/Throat:     Mouth: Mucous membranes are dry.  Cardiovascular:     Comments: Heart sounds distant, peripheral pulses weak Pulmonary:     Breath sounds: Normal breath sounds.  Skin:    General: Skin is dry.     Coloration: Skin is pale.  Neurological:     Comments: nonresponsive     Vital Signs: BP (!) 93/54 (BP Location: Left Arm)    Pulse 88    Temp 97.8 F (36.6 C) (Axillary)    Resp 16    Ht 5\' 4"  (1.626 m)    Wt 60.5 kg    SpO2 100%    BMI 22.89 kg/m  Pain Scale: PAINAD       SpO2: SpO2: 100 % O2 Device:SpO2: 100 % O2 Flow Rate: .   IO: Intake/output summary:   Intake/Output Summary (Last 24 hours) at 05/28/2018 1415 Last data filed at 05/28/2018 0600 Gross per 24 hour  Intake 1992.11 ml  Output 250 ml  Net 1742.11 ml    LBM: Last BM Date: 05/27/18 Baseline Weight: Weight: 61.2 kg Most recent weight: Weight: 60.5 kg     Palliative Assessment/Data: PPS: 10%     Thank you for this consult. Palliative medicine will continue to follow and assist as needed.   Time In: 1400 Time Out: 1515 Time Total: 75 minutes Greater than 50%  of this time was spent counseling and coordinating care related to the above assessment and plan.  Signed by: Mariana Kaufman, AGNP-C Palliative Medicine    Please contact Palliative Medicine Team phone at [phone] for questions and concerns.  For individual provider: See Shea Evans

## 2018-05-28 NOTE — Progress Notes (Addendum)
Family Medicine Teaching Service Daily Progress Note Intern Pager: [phone]  Patient name: Kyle York Medical record number: [id number] Date of birth: [birth year] Age: 85 y.o. Gender: male  Primary Care Provider: Jefm Petty, MD Consultants: none Code Status: DNR, supportive measures for infections or other nonheroic treatments  Pt Overview and Major Events to Date:  4/14: Admit for AMS and FFT  Assessment and Plan: [name] is a 85 y.o. male presenting with AMS and FTT. PMH is significant for alzheimer dementia, hypothyroidism, BPH, HTN, melanoma of eyelid.  1.  Altered mental status in the setting of advanced Alzheimer's disease and failure to thrive: Unchanged.  Patient admitted for altered mental status which is complicated by underlying Alzheimer's dementia.  There is concerns for failure to thrive with difficulty achieving oral intake and presented with hypotension and hypothermia.  Sepsis protocol was initiated.  Patient seems to be attaining adequate temperature off the [facility] as of this morning.  COVID test negative on arrival given history of positive COVID patients at facility in separate building.  Usually started on broad-spectrum antibiotics but likely aspiration pneumonia.  CXR did not reveal any infiltrate but does report a small left pleural effusion.  Blood and urine cultures obtained.  Patient is DNR and wife is requesting supportive measures including antibiotics and fluids at this time.  Unfortunately Mr. Kyle York appears to be at the end stage of his Alzheimer's dementia.  We will definitely need palliative's assistance along with the wife's recommendations moving forward. - Discontinuing vancomycin and continuing cefepime, day 2 - Awaiting final blood and urine cultures, negative to date day 2 - Giving additional 1L NS bolus in addition to IVF 75 cc/h given n.p.o. status - Palliative consulted, appreciate recommendations - Continuing lower  dose of home Xanax at 0.25 mg twice daily but holding Depakote 250 mg twice daily, risperidone 0.75 mg twice daily, Zoloft 50 mg daily, trazodone 50 mg daily, Remeron 7.5 mg nightly, and Namenda 28 mg daily - We will try to avoid soft restraints with restarting Xanax  2.  Hypothermia and hypotension: Improved.  Normothermic with MAPs in the mid 70s.  No clear sign for cause.  Suspect poor oral intake is contributing to hypotension. - See plan per above  3.  Hypothyroidism:  Chronic.  Synthroid on home med list -hold home meds  4.  BPH: Chronic.  On finasteride on med list. -hold home meds  5.  Depression/anxiety- on remeron, zoloft, risperidone, trazodone, xanax per home med list -hold home psych meds  6. HTN- lisinopril on home med list -hold home meds  FEN/GI: NPO while altered Prophylaxis: lovenox  Disposition: pending palliative recs.  Subjective:  Pending palliative recs in the setting of what appears to be worsening advanced Alzheimer dementia.  Objective: Temp:  [91 F (32.8 C)-98.5 F (36.9 C)] 98.5 F (36.9 C) (04/15 0600) Pulse Rate:  [58-99] 87 (04/15 0047) Resp:  [11-22] 18 (04/15 0047) BP: (90-161)/(60-117) 90/64 (04/14 2341) SpO2:  [94 %-100 %] 94 % (04/15 0047) Weight:  [60.5 kg-61.2 kg] 60.5 kg (04/14 2050) Physical Exam: General: frail elderly male lying in bed with generalized tremor and moaning, does not follow commands but pushes away with touch HEENT: normocephalic, atraumatic, moist mucous membranes Neck: supple, non-tender without lymphadenopathy, absent JVD Cardiovascular: regular rate and rhythm without murmurs, rubs, or gallops Lungs: clear to auscultation bilaterally with normal work of breathing Abdomen: soft, non-tender, non-distended, normoactive bowel sounds Skin: warm, dry, no rashes or lesions, cap refill <  2 seconds Extremities: warm and well perfused, normal tone, no edema Neuro: grossly intact moving all 4  extremities  Laboratory: Recent Labs  Lab 05/27/18 1507  WBC 6.8  HGB 11.0*  HCT 36.5*  PLT 109*   Recent Labs  Lab 05/27/18 1507  NA 146*  K 4.2  CL 108  CO2 29  BUN 28*  CREATININE 1.57*  CALCIUM 9.0  PROT 5.4*  BILITOT 0.4  ALKPHOS 76  ALT 29  AST 18  GLUCOSE 77   COVID: Negative Blood cultures: Pending UA: Negative, 15 ketones Lactic acid: 1.7  Imaging/Diagnostic Tests: PORTABLE CHEST 1 VIEW (05/27/2018) IMPRESSION: Small left pleural effusion with lateral left base atelectasis. Lungs elsewhere clear. Heart size normal. No adenopathy. There is left carotid artery calcification. Aortic Atherosclerosis (ICD10-I70.0).  EKG 12-LEAD (05/27/2018) Sinus rhythm 73 bpm, QTc 461, no ST changes or t-wave abnormalities    Evergreen Bing, DO 05/28/2018, 6:56 AM PGY-3, [place] Intern pager: [phone], text pages welcome

## 2018-05-28 NOTE — Progress Notes (Signed)
Garlan Fillers, MD called back after paging him via amion and is aware that nurse recently tried to place wrist restraints and that patient became agitated.  Estill Bamberg, RN charge nurse spoke with MD because this RN in room with patient.  MD gave order that it's okay to discontinue restraint order.  Restraints were never used since admission to restrain patient.

## 2018-05-28 NOTE — Progress Notes (Signed)
Remains on bair hugger and temperature is normalized and maintaining on medium heat.  Patient calm unless nursing care is being performed such as finger sticks and vital signs.  Does not open eyes and does not follow commands or respond when asked questions.

## 2018-05-29 LAB — GLUCOSE, CAPILLARY
Glucose-Capillary: 130 mg/dL — ABNORMAL HIGH (ref 70–99)
Glucose-Capillary: 141 mg/dL — ABNORMAL HIGH (ref 70–99)
Glucose-Capillary: 65 mg/dL — ABNORMAL LOW (ref 70–99)
Glucose-Capillary: 70 mg/dL (ref 70–99)
Glucose-Capillary: 81 mg/dL (ref 70–99)

## 2018-05-29 MED ORDER — BIOTENE DRY MOUTH MT LIQD: 15.0000 mL | OROMUCOSAL | Status: DC | PRN

## 2018-05-29 MED ORDER — DEXTROSE-NACL 5-0.45 % IV SOLN
INTRAVENOUS | Status: DC
  Administered 2018-05-29: 14:00:00 via INTRAVENOUS

## 2018-05-29 MED ORDER — DIPHENHYDRAMINE HCL 50 MG/ML IJ SOLN: 12.5000 mg | INTRAMUSCULAR | Status: DC | PRN

## 2018-05-29 MED ORDER — POLYVINYL ALCOHOL 1.4 % OP SOLN
1.0000 [drp] | Freq: Four times a day (QID) | OPHTHALMIC | Status: DC | PRN
  Filled 2018-05-29: qty 15

## 2018-05-29 MED ORDER — LORAZEPAM 2 MG/ML IJ SOLN: 1.0000 mg | INTRAMUSCULAR | Status: DC | PRN

## 2018-05-29 MED ORDER — GLYCOPYRROLATE 0.2 MG/ML IJ SOLN
0.2000 mg | INTRAMUSCULAR | Status: DC | PRN
  Administered 2018-05-29: 0.2 mg via INTRAVENOUS
  Filled 2018-05-29: qty 1

## 2018-05-29 MED ORDER — DEXTROSE 50 % IV SOLN
12.5000 g | INTRAVENOUS | Status: AC
  Administered 2018-05-29: 12.5 g via INTRAVENOUS
  Filled 2018-05-29: qty 50

## 2018-05-29 MED ORDER — LORAZEPAM 1 MG PO TABS: 1.0000 mg | ORAL_TABLET | ORAL | Status: DC | PRN

## 2018-05-29 MED ORDER — LORAZEPAM 2 MG/ML PO CONC: 1.0000 mg | ORAL | Status: DC | PRN

## 2018-05-29 MED ORDER — GLYCOPYRROLATE 1 MG PO TABS: 1.0000 mg | ORAL_TABLET | ORAL | Status: DC | PRN

## 2018-05-29 MED ORDER — GLYCOPYRROLATE 0.2 MG/ML IJ SOLN: 0.2000 mg | INTRAMUSCULAR | Status: DC | PRN

## 2018-05-29 MED ORDER — MORPHINE SULFATE (PF) 2 MG/ML IV SOLN: 1.0000 mg | INTRAVENOUS | Status: DC | PRN

## 2018-05-29 NOTE — Progress Notes (Signed)
Daily Progress Note   Patient Name: Kyle York       Date: 05/29/2018 DOB: January 26, 1925  Age: 85 y.o. MRN#: [id number] Attending Physician: Zenia Resides, MD Primary Care Physician: Jefm Petty, MD Admit Date: 05/27/2018  Reason for Consultation/Follow-up: Establishing goals of care  Subjective: Evaluated patient in bed. Continues to be altered. Some mumbling today. Accepted a few bites of applesauce and a few spoonfuls of water. Some coughing after water.  Spoke with patient's spouseSilva York. She and family are wanting Hospice. Discussed with her residential hospice- she inquired about continuing antibiotics.  Discussed with her what the [place] of continuing antibiotics would be, and that comfort care at residential Hospice does not include antibiotics- although in this situation, I don't think antibiotics are going to make a difference in patient's overall trajectory.  If patient's completes course of antibiotics during hospital stay and is discharged back to SNF- [facility] services are not currently be provided there due to [place] outbreak. Additionally, family visits are limited at nursing facility, and there is concern that patient would die before family is able to see him. Kyle York is discussing above with her son.   Review of Systems  Unable to perform ROS: Medical condition    Length of Stay: 2  Current Medications: Scheduled Meds:  . ALPRAZolam  0.25 mg Oral BID  . enoxaparin (LOVENOX) injection  30 mg Subcutaneous Q24H  . mouth rinse  15 mL Mouth Rinse BID    Continuous Infusions: . ceFEPime (MAXIPIME) IV 1 g (05/28/18 1444)  . dextrose 5 % and 0.45% NaCl 75 mL/hr at 05/29/18 1330    PRN Meds: acetaminophen **OR** acetaminophen  Physical Exam  Vitals signs and nursing note reviewed.  Constitutional:      Appearance: He is ill-appearing.  Skin:    Coloration: Skin is pale.  Neurological:     Mental Status: He is disoriented.     Comments: Does not open eyes  Psychiatric:     Comments: Agitated with attempts at care             Vital Signs: BP 129/63 (BP Location: Left Arm)   Pulse 70   Temp 97.7 F (36.5 C) (Oral)   Resp 16   Ht 5\' 4"  (1.626 m)   Wt 60.5 kg  SpO2 100%   BMI 22.89 kg/m  SpO2: SpO2: 100 % O2 Device: O2 Device: Nasal Cannula O2 Flow Rate: O2 Flow Rate (L/min): 2 L/min  Intake/output summary:   Intake/Output Summary (Last 24 hours) at 05/29/2018 1627 Last data filed at 05/29/2018 1500 Gross per 24 hour  Intake 492 ml  Output 950 ml  Net -458 ml   LBM: Last BM Date: 05/27/18 Baseline Weight: Weight: 61.2 kg Most recent weight: Weight: 60.5 kg       Palliative Assessment/Data: PPS: 10%     Patient Active Problem List   Diagnosis Date Noted  . Dehydration   . Dementia with behavioral disturbance ([place])   . Aspiration pneumonia ([place])   . Palliative care by specialist   . Goals of care, counseling/discussion   . Advanced care planning/counseling discussion   . Sepsis ([place]) 05/27/2018  . Cancer ([place]) 05/14/2011  . Alzheimer disease ([place]) 04/13/2010    Palliative Care Assessment & Plan   Patient Profile: 85 y.o. male  with past medical history of Alzheimer's dementia,  admitted on 05/27/2018 with three day history of not eating drinking, progressive weakness. Workup revealed hypothermia, mild hypotension, mild pleural effusion, some agitation- suspicious for sepsis- no de facto infectious etiology found to date. COVID-19 test returned negative, but there were COVID positive patients at his facility. Palliative medicine consulted for [place].   Assessment/Recommendations/Plan   Family leaning toward comfort care and hospice- if they choose to continue antibiotics patient will likely not  qualify for residential hospice  Unfortunately Hospice is not serving patient's at his current facility- [facility] due to [place] restrictions  While he still could continue antibiotics- and D/C back to SNF with GOC to be comfort and not return to hospital- I have concerns that his limited time (which is not likely to be affected by antibiotics in the mean time)- would be spent away from his family without proper EOL care- therefore, I would recommend antibiotics be stopped so that he could be referred to residential hospice house   Goals of Care and Additional Recommendations:  Limitations on Scope of Treatment: No Artificial Feeding  Code Status:  DNR  Prognosis:   < 2 weeks = due to advanced alzheimer's dementia, not eating or drinking  Discharge Planning:  To Be Determined  Care plan was discussed with patient's wife, Kyle York, and Dr. Garlan Fillers.  Thank you for allowing the Palliative Medicine Team to assist in the care of this patient.   Time In: 1000, 1530 Time Out: 1030, 1600 Total Time 60 mins Prolonged Time Billed yes      Greater than 50%  of this time was spent counseling and coordinating care related to the above assessment and plan.  Mariana Kaufman, AGNP-C Palliative Medicine   Please contact Palliative Medicine Team phone at [phone] for questions and concerns.

## 2018-05-29 NOTE — Progress Notes (Signed)
SLP Cancellation Note  Patient Details Name: Kyle York MRN: [id number] DOB: 1924/09/02   Cancelled treatment:       Reason Eval/Treat Not Completed: Patient's level of consciousness. Spoke with patient's RN who stated that he is not consistently alert, does not follow commands. Per chart review patient will likely be transitioned to palliative, hospice care. Will follow-up next date.   [name] 05/29/2018, 4:11 PM    Sonia Baller, MA, CCC-SLP Speech Therapy [facility] Pager: [phone]

## 2018-05-29 NOTE — Progress Notes (Signed)
FPTS Interim Note:   Dr. Garlan Fillers discussed [place] again with patient's wife this evening and clarified that we would all like Kyle York to be as comfortable as possible and around family if possible as he has a limited prognosis in the setting of end stage dementia. Dr. Garlan Fillers clarified with Kyle York that antibiotics would have minimal to no benefit and we think residential hospice would be appropriate. She voiced understanding and asked for him to be changed to comfort measures and to be admitted to residential hospice as soon as possible. We did try to call CSW to assess if [facility] would have a bed tonight, but it was already after hours. We will plan for discharge to residential hospice as soon as a bed is available, hopefully 4/17.   Ralene Ok, MD

## 2018-05-29 NOTE — Progress Notes (Signed)
Family Medicine Teaching Service Daily Progress Note Intern Pager: [phone]  Patient name: Kyle York Medical record number: [id number] Date of birth: Jun 06, 1924 Age: 85 y.o. Gender: male  Primary Care Provider: Jefm Petty, MD Consultants: none Code Status: DNR, supportive measures for infections or other nonheroic treatments  Pt Overview and Major Events to Date:  4/14: Admit for AMS and FFT  Assessment and Plan: Kyle York is a 85 y.o. male presenting with AMS and FTT. PMH is significant for alzheimer dementia, hypothyroidism, BPH, HTN, melanoma of eyelid.  1.  Altered mental status in the setting of advanced Alzheimer's disease and failure to thrive: No change in acute on chronic altered mental status.  Unclear if this is due to acute infection vs worsening underlying Alzheimer's dementia. He has CXR did not reveal any infiltrate but does report a small left pleural effusion but we are suspicious for aspiration PNA, signalling he could be entering the final stages of Alzheimer's. Patient is DNR and wife is requesting supportive measures including antibiotics and fluids at this time. Palliative note yesterday states they anticipate making change to comfort care measures only. - Discontinuing vancomycin and continuing cefepime, day 3 - Awaiting final blood (NG x 1 day) and urine cultures NG - cont maintenance IVF 75 cc/h given n.p.o. status - Palliative consulted; f/u their note today but anticipate change to comfort care measures only - Continuing lower dose of home Xanax at 0.25 mg twice daily but holding Depakote 250 mg twice daily, risperidone 0.75 mg twice daily, Zoloft 50 mg daily, trazodone 50 mg daily, Remeron 7.5 mg nightly, and Namenda 28 mg daily - We will try to avoid soft restraints with restarting Xanax  2.  Hypothermia and hypotension: Improved. Temp of 97.4-97.8 past 24hrs. MAPs in the mid 29s.  No clear sign for cause.  Suspect poor oral intake  is contributing to hypotension. - See plan per above  3.  Hypothyroidism:  Chronic.Synthroid on home med list - cont to hold home meds  4.  BPH: Chronic. On finasteride on med list.  cont to-hold home meds  5.  Depression/anxiety- on remeron, zoloft, risperidone, trazodone, xanax per home med list - cont hold home psych meds  6. HTN- lisinopril on home med list - cont hold home meds  FEN/GI: NPO while altered Prophylaxis: lovenox  Disposition: pending palliative recs.  Subjective:  Patient is minimally responsive to verbal commands and touch. He would not speak, only grunting to acknowledge my presence and pushes my hands away as I attempt to perform a physical exam.  Objective: Temp:  [97.4 F (36.3 C)-97.8 F (36.6 C)] 97.4 F (36.3 C) (04/16 0057) Pulse Rate:  [53-88] 53 (04/16 0057) Resp:  [16] 16 (04/15 0800) BP: (93-155)/(54-74) 155/74 (04/16 0057) SpO2:  [88 %-100 %] 88 % (04/16 0057) Physical Exam: Gen: elderly, cachexic appearing male HEENT: Normocephalic, atraumatic CV: faint heart sounds, RRR, no murmurs, normal S1, S2 split Resp: NWOB, upper air lung fields CTAB, unable to listen to the back as patient cannot follow commands Abd: non-distended, non-tender, soft, +bs in all four quadrants Ext: no clubbing, cyanosis, or edema Skin: warm, dry, intact, no rashes  Laboratory: Recent Labs  Lab 05/27/18 1507  WBC 6.8  HGB 11.0*  HCT 36.5*  PLT 109*   Recent Labs  Lab 05/27/18 1507 05/28/18 0756  NA 146* 146*  K 4.2 4.8  CL 108 110  CO2 29 25  BUN 28* 25*  CREATININE 1.57* 1.38*  CALCIUM  9.0 8.6*  PROT 5.4*  --   BILITOT 0.4  --   ALKPHOS 76  --   ALT 29  --   AST 18  --   GLUCOSE 77 68*   COVID: Negative Blood cultures: NG x 1 day UCx: NG UA: Negative, 15 ketones Lactic acid: 1.7  Imaging/Diagnostic Tests: PORTABLE CHEST 1 VIEW (05/27/2018) IMPRESSION: Small left pleural effusion with lateral left base atelectasis. Lungs elsewhere  clear. Heart size normal. No adenopathy. There is left carotid artery calcification. Aortic Atherosclerosis (ICD10-I70.0).  EKG 12-LEAD (05/27/2018) Sinus rhythm 73 bpm, QTc 461, no ST changes or t-wave abnormalities  Nuala Alpha, DO 05/29/2018, 7:57 AM PGY-2, [place] Intern pager: [phone], text pages welcome

## 2018-05-30 NOTE — Discharge Summary (Addendum)
[facility] Discharge Summary  Patient name: Kyle York Medical record number: [id number] Date of birth: [birth year] Age: 85 y.o. Gender: male Date of Admission: 05/27/2018  Date of Discharge: 05/30/2018 Admitting Physician: Zenia Resides, MD  Primary Care Provider: Jefm Petty, MD Consultants: palliative  Indication for Hospitalization: failure to thrive  Discharge Diagnoses/Problem List:  Advanced alzheimer dementia Failure to thrive Hypothermia Hypertension Hypothyroidism BPH Depression Anxiety Hypertension  Disposition: [facility] [place] for residential hospice  Discharge Condition: stable  Discharge Exam:  General: elderly male lying in bed comfortably, moans when touched or spoken to HEENT: normocephalic, atraumatic, dry lips Cardiovascular: regular rate and rhythm without murmurs, rubs, or gallops Lungs: clear to auscultation bilaterally with normal work of breathing Abdomen: soft, non-tender, non-distended, normoactive bowel sounds Skin: warm, dry, no rashes or lesions, cap refill < 2 seconds Extremities: warm and well perfused, normal tone, no edema Neuro: resting tremor, moves all 4 extremities  Brief Hospital Course:  Kyle Faucett Johnsonis a 85 y.o.malepresenting with AMS and FTT. PMH is significant foralzheimer dementia, hypothyroidism, BPH, HTN, melanoma of eyelid.  Patient presented to [facility] ED on 05/27/2018 following 3 days of increased oral intake and progressive weakness.  He presented with hypothermia requiring the Bair hugger and mild hypotension with agitation.  There was initial concern for sepsis without identified source and was treated appropriately.  There was a history of COVID patients at his facility in a separate building, however tested negative and there was low suspicion that this was the source.  Hypotension did improve with fluid resuscitation and broad-spectrum  antibiotics were eventually discontinued given negative CXR, resolved hypothermia, and negative growth on blood and urine cultures.  Patient's mentation remained unchanged and was thought to be related to end-stage Alzheimer's dementia.  This was suspected to be the source of his failure to thrive.  Palliative care was consulted and decision was made to transition patient to DNR and pursue comfort care lung with transfer to [facility] with life expectancy less than 2 weeks.  Issues for Follow Up:  1. DNR after discussion with palliative and wife. Suspect <2 weeks of life given advanced Alzheimer dementia.  2. Patient had difficulty eating and was transitioned to dysphagia 1 diet with successful oral intake. Decision not to pursue feeding tube.  Significant Procedures: none  Significant Labs and Imaging:  Recent Labs  Lab 05/27/18 1507  WBC 6.8  HGB 11.0*  HCT 36.5*  PLT 109*   Recent Labs  Lab 05/27/18 1507 05/28/18 0756  NA 146* 146*  K 4.2 4.8  CL 108 110  CO2 29 25  GLUCOSE 77 68*  BUN 28* 25*  CREATININE 1.57* 1.38*  CALCIUM 9.0 8.6*  ALKPHOS 76  --   AST 18  --   ALT 29  --   ALBUMIN 3.0*  --    COVID: Negative Blood cultures: Negative UA: Negative, 15 ketones Lactic acid: 1.7  Imaging/Diagnostic Tests: PORTABLE CHEST 1 VIEW (05/27/2018) IMPRESSION: Small left pleural effusion with lateral left base atelectasis. Lungs elsewhere clear. Heart size normal. No adenopathy. There is left carotid artery calcification. Aortic Atherosclerosis (ICD10-I70.0).  EKG 12-LEAD (05/27/2018) Sinus rhythm 73 bpm, QTc 461, no ST changes or t-wave abnormalities  Results/Tests Pending at Time of Discharge: none  Discharge Medications:  Allergies as of 05/30/2018      Reactions   Gluten Meal Other (See Comments)   Per [facility]      Medication List  STOP taking these medications   ALPRAZolam 0.5 MG tablet Commonly known as:  XANAX   divalproex 125 MG capsule Commonly known  as:  DEPAKOTE SPRINKLE   finasteride 5 MG tablet Commonly known as:  PROSCAR   folic acid 1 MG tablet Commonly known as:  FOLVITE   galantamine 24 MG 24 hr capsule Commonly known as:  RAZADYNE ER   hydrocortisone 2.5 % lotion   levothyroxine 50 MCG tablet Commonly known as:  SYNTHROID   lisinopril 10 MG tablet Commonly known as:  ZESTRIL   memantine 28 MG Cp24 24 hr capsule Commonly known as:  NAMENDA XR   mirtazapine 7.5 MG tablet Commonly known as:  REMERON   modafinil 100 MG tablet Commonly known as:  PROVIGIL   mupirocin ointment 2 % Commonly known as:  BACTROBAN   risperiDONE 0.5 MG tablet Commonly known as:  RISPERDAL   sertraline 50 MG tablet Commonly known as:  ZOLOFT   traZODone 50 MG tablet Commonly known as:  DESYREL   vitamin B-12 1000 MCG tablet Commonly known as:  CYANOCOBALAMIN       Discharge Instructions: Please refer to Patient Instructions section of EMR for full details.  Patient was counseled important signs and symptoms that should prompt return to medical care, changes in medications, dietary instructions, activity restrictions, and follow up appointments.   Follow-Up Appointments:   Narcissa Bing, DO 05/30/2018, 1:56 PM PGY-3, [place]

## 2018-05-30 NOTE — Progress Notes (Signed)
Family Medicine Teaching Service Daily Progress Note Intern Pager: [phone]  Patient name: Kyle York Medical record number: [id number] Date of birth: 1924/08/30 Age: 85 y.o. Gender: male  Primary Care Provider: Jefm Petty, MD Consultants: none Code Status: DNR, supportive measures for infections or other nonheroic treatments  Pt Overview and Major Events to Date:  4/14: Admit for AMS and FFT 4/16: GOC disucssion between palliative and wife > d/c abx and work towards residential hospice, CSW consuled  Assessment and Plan: Kyle York is a 85 y.o. male presenting with AMS and FTT. PMH is significant for alzheimer dementia, hypothyroidism, BPH, HTN, melanoma of eyelid.  1.  Altered mental status in the setting of advanced Alzheimer's disease and failure to thrive: Unchanged.  Patient admitted for altered mental status which is complicated by underlying Alzheimer's dementia.  There is concerns for failure to thrive with difficulty achieving oral intake and presented with hypotension and hypothermia.  Sepsis protocol was initiated.  Patient seems to be attaining adequate temperature off the [facility] as of this morning.  COVID test negative on arrival given history of positive COVID patients at facility in separate building.  Usually started on broad-spectrum antibiotics but likely aspiration pneumonia.  CXR did not reveal any infiltrate but does report a small left pleural effusion.  Blood and urine cultures obtained.  Patient is DNR and wife is requesting supportive measures including antibiotics and fluids at this time.  Unfortunately Mr. Bettendorf appears to be at the end stage of his Alzheimer's dementia.  We will definitely need palliative's assistance along with the wife's recommendations moving forward. - Discontinuing vancomycin and continuing cefepime, day 2 - Awaiting final blood and urine cultures, negative to date day 2 - Giving additional 1L NS bolus in  addition to IVF 75 cc/h given n.p.o. status - Palliative consulted, appreciate recommendations - Continuing lower dose of home Xanax at 0.25 mg twice daily but holding Depakote 250 mg twice daily, risperidone 0.75 mg twice daily, Zoloft 50 mg daily, trazodone 50 mg daily, Remeron 7.5 mg nightly, and Namenda 28 mg daily - We will try to avoid soft restraints with restarting Xanax  2.  Hypothermia and hypotension: Improved.  Normothermic with MAPs in the mid 70s.  No clear sign for cause.  Suspect poor oral intake is contributing to hypotension. - See plan per above  3.  Hypothyroidism:  Chronic.  Synthroid on home med list -hold home meds  4.  BPH: Chronic.  On finasteride on med list. -hold home meds  5.  Depression/anxiety- on remeron, zoloft, risperidone, trazodone, xanax per home med list -hold home psych meds  6. HTN- lisinopril on home med list -hold home meds  FEN/GI: NPO while altered Prophylaxis: lovenox  Disposition: anticipate d/c to [facility] 05/30/2018.  Subjective:  Patient unable to provide any meaningful history given end-stage alzh dz.  Objective: Temp:  [97.7 F (36.5 C)] 97.7 F (36.5 C) (04/16 0837) Pulse Rate:  [70] 70 (04/16 0837) BP: (129)/(63) 129/63 (04/16 0837) SpO2:  [89 %-100 %] 100 % (04/16 0959) Physical Exam: General: elderly male lying in bed comfortably, moans when touched or spoken to HEENT: normocephalic, atraumatic, dry lips Cardiovascular: regular rate and rhythm without murmurs, rubs, or gallops Lungs: clear to auscultation bilaterally with normal work of breathing Abdomen: soft, non-tender, non-distended, normoactive bowel sounds Skin: warm, dry, no rashes or lesions, cap refill < 2 seconds Extremities: warm and well perfused, normal tone, no edema Neuro: resting tremor, moves all 4  extremities  Laboratory: Recent Labs  Lab 05/27/18 1507  WBC 6.8  HGB 11.0*  HCT 36.5*  PLT 109*   Recent Labs  Lab 05/27/18 1507  05/28/18 0756  NA 146* 146*  K 4.2 4.8  CL 108 110  CO2 29 25  BUN 28* 25*  CREATININE 1.57* 1.38*  CALCIUM 9.0 8.6*  PROT 5.4*  --   BILITOT 0.4  --   ALKPHOS 76  --   ALT 29  --   AST 18  --   GLUCOSE 77 68*   COVID: Negative Blood cultures: Pending UA: Negative, 15 ketones Lactic acid: 1.7  Imaging/Diagnostic Tests: PORTABLE CHEST 1 VIEW (05/27/2018) IMPRESSION: Small left pleural effusion with lateral left base atelectasis. Lungs elsewhere clear. Heart size normal. No adenopathy. There is left carotid artery calcification. Aortic Atherosclerosis (ICD10-I70.0).  EKG 12-LEAD (05/27/2018) Sinus rhythm 73 bpm, QTc 461, no ST changes or t-wave abnormalities    [place] Bing, DO 05/30/2018, 7:06 AM PGY-3, [place] Intern pager: [phone], text pages welcome

## 2018-05-30 NOTE — TOC Initial Note (Addendum)
Transition of Care [facility]) - Initial/Assessment Note    Patient Details  Name: Kyle York MRN: [id number] Date of Birth: November 12, 1924  Transition of [facility]) CM/SW Contact:    Candie Chroman, LCSW Phone Number: 05/30/2018, 9:17 AM  Clinical Narrative: Received consult regarding hospice placement. Per palliative/MD notes, patient's wife agreeable to hospice house. First preference [employer]. Referral made to Farrel Gordon, RN.        12:22 pm: Patient's wife and son have decided to move forward with [facility] for their [facility] facility because they are allowed to visit throughout his stay. Referral was made to [place], Therapist, sports.  Expected Discharge Plan: Hospice Medical Facility Barriers to Discharge: Other (comment)(Waiting on referral decision.)   Patient Goals and CMS Choice     Choice offered to / list presented to : Spouse(Per palliative/MD)  Expected Discharge Plan and Services Expected Discharge Plan: [place]     Post Acute Care Choice: Hospice Living arrangements for the past 2 months: Assisted Living Facility(Memory care)                          Prior Living Arrangements/Services Living arrangements for the past 2 months: Assisted Living Facility(Memory care) Lives with:: Facility Resident Patient language and need for interpreter reviewed:: No Do you feel safe going back to the place where you live?: Yes      Need for Family Participation in Patient Care: Yes (Comment) Care giver support system in place?: Yes (comment)(Lives at facility. Plan for hospice house.)   Criminal Activity/Legal Involvement Pertinent to Current Situation/Hospitalization: No - Comment as needed  Activities of Daily Living      Permission Sought/Granted         Permission granted to share info w AGENCY: Hospice facilities        Emotional Assessment Appearance:: Appears stated age Attitude/Demeanor/Rapport: Unable to  Assess Affect (typically observed): Unable to Assess   Alcohol / Substance Use: Never Used Psych Involvement: No (comment)  Admission diagnosis:  weakness not eating X3 days Patient Active Problem List   Diagnosis Date Noted  . Dehydration   . Dementia with behavioral disturbance ([place])   . Aspiration pneumonia ([place])   . Palliative care by specialist   . Goals of care, counseling/discussion   . Advanced care planning/counseling discussion   . Sepsis ([place]) 05/27/2018  . Cancer ([place]) 05/14/2011  . Alzheimer disease ([place]) 04/13/2010   PCP:  Jefm Petty, MD Pharmacy:  No Pharmacies Listed    Social Determinants of Health (SDOH) Interventions    Readmission Risk Interventions No flowsheet data found.

## 2018-05-30 NOTE — TOC Transition Note (Signed)
Transition of Care [facility]) - CM/SW Discharge Note   Patient Details  Name: Ayden Hardwick MRN: [id number] Date of Birth: 1924/04/17  Transition of Care [facility] CM/SW Contact:  Candie Chroman, LCSW Phone Number: 05/30/2018, 2:11 PM   Clinical Narrative:  CSW facilitated patient discharge including contacting patient family and facility to confirm patient discharge plans. Clinical information faxed to facility and family agreeable with plan. CSW arranged ambulance transport via [facility] to [place]. RN to call report prior to discharge [phone]).  CSW will sign off for now as social work intervention is no longer needed. Please consult [place] again if new needs arise.  Final next level of care: [place] Barriers to Discharge: Barriers Resolved   Patient Goals and CMS Choice     Choice offered to / list presented to : Spouse  Discharge Placement              Patient [facility] at: [facility]) Patient to be transferred to facility by: [place] Name of family member notified: Azarel Banner Patient and family notified of of transfer: 05/30/18  Discharge Plan and Services     Post Acute Care Choice: Hospice                    Social Determinants of Health (SDOH) Interventions     Readmission Risk Interventions No flowsheet data found.

## 2018-05-30 NOTE — Progress Notes (Signed)
Manufacturing engineer [facility]  Received referral for residential hospice, requesting [facility].  Spoke with wife [name], explained services and answered questions.  She would prefer [facility], but wanted to know visitation policy for the other campuses locally.  Explained [place] is the same.  She wanted to see if [facility] would be more lenient with visitation.  Updated LCSW with request.  Thank you, Venia Carbon RN, BSN, [facility] Liaison [phone]

## 2018-05-30 NOTE — Progress Notes (Signed)
Daily Progress Note   Patient Name: Kyle York       Date: 05/30/2018 DOB: [birth year]  Age: 85 y.o. MRN#: [id number] Attending Physician: Zenia Resides, MD Primary Care Physician: Jefm Petty, MD Admit Date: 05/27/2018  Reason for Consultation/Follow-up: Establishing goals of care  Subjective: Patient continues with altered mental status, no po intake. In bed this morning, non verbal. Per RN report there was residual food in his mouth with mouth care. Per attending team note and discussion with family- final decision for full transition to comfort care and residential hospice has been made.  Patient will need symptom management for agitation. He is not taking po medications this morning- may need IV medication administration.           Palliative Assessment/Data: PPS: 10%      Patient Active Problem List   Diagnosis Date Noted  . Dehydration   . Dementia with behavioral disturbance ([place])   . Aspiration pneumonia ([place])   . Palliative care by specialist   . Goals of care, counseling/discussion   . Advanced care planning/counseling discussion   . Sepsis ([place]) 05/27/2018  . Cancer ([place]) 05/14/2011  . Alzheimer disease ([place]) 04/13/2010    Palliative Care Assessment & Plan   Patient Profile:  85 y.o. male  with past medical history of Alzheimer's dementia,  admitted on 05/27/2018 with three day history of not eating drinking, progressive weakness. Workup revealed hypothermia, mild hypotension, mild pleural effusion, some agitation- suspicious for sepsis- no de facto infectious etiology found to date. COVID-19 test returned negative, but there were COVID positive patients at his facility. Palliative medicine consulted for [place].   Assessment/Recommendations/Plan    Agree with comfort medications ordered per attending team and referral to residential Hospice, PMT will follow and provide support as needed  Patient with some agitation- not taking po meds this morning- encouraged RN to use IV medication  Goals of Care and Additional Recommendations:  Limitations on Scope of Treatment: Full Comfort Care  Code Status:  DNR  Prognosis:   < 2 weeks due to advanced alzheimer's, aspiration pnuemonia, no po intake  Discharge Planning:  Hospice facility  Care plan was discussed with patient's RN  Thank you for allowing the Palliative Medicine Team to assist in the care of this patient.   Time In: 1015  Time Out: 1050 Total Time 35 minutes Prolonged Time Billed no      Greater than 50%  of this time was spent counseling and coordinating care related to the above assessment and plan.  Mariana Kaufman, AGNP-C Palliative Medicine   Please contact Palliative Medicine Team phone at [phone] for questions and concerns.

## 2018-06-01 LAB — CULTURE, BLOOD (ROUTINE X 2)
Culture: NO GROWTH
Culture: NO GROWTH
Special Requests: ADEQUATE

## 2018-06-13 DEATH — deceased

## 2019-07-30 IMAGING — CT CT HEAD W/O CM
3 series · 15 of 47 positions shown, 18 images · non-contrast
Comparison: 09/21/2016 head CT at [REDACTED]

CLINICAL DATA: Fall backwards in parking not with laceration to
posterior head. Initial encounter.

EXAM:
CT HEAD WITHOUT CONTRAST
TECHNIQUE: Contiguous axial images were obtained from the base of the skull
through the vertex without intravenous contrast.

[Series 2: head wo · axial · 0.44mm/px · z∈[-407,-272]mm · 9 of 33 slices shown, 12 images]
[im 3/33  brain]
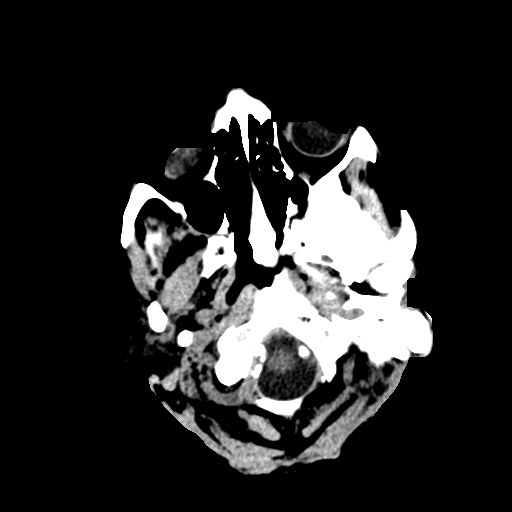
[im 3/33  bone]
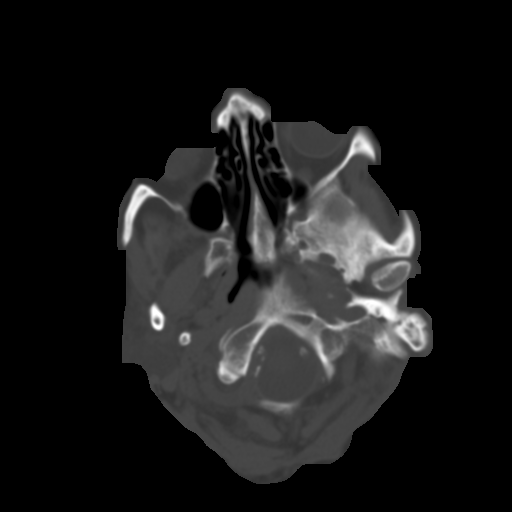
[im 6/33  brain]
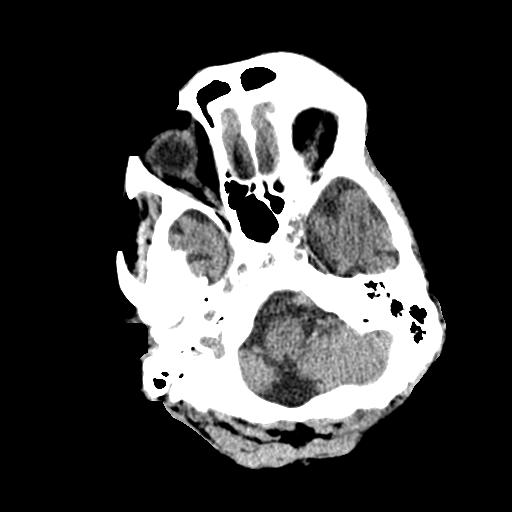
[im 9/33  brain]
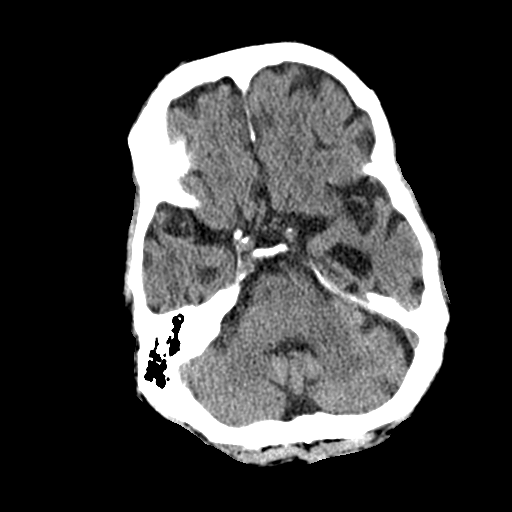
[im 13/33  brain]
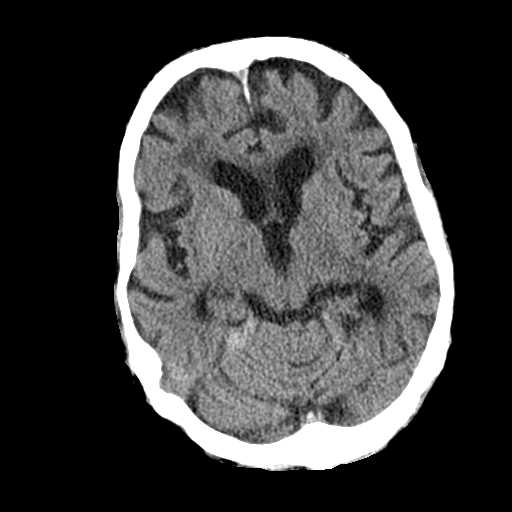
[im 17/33  brain]
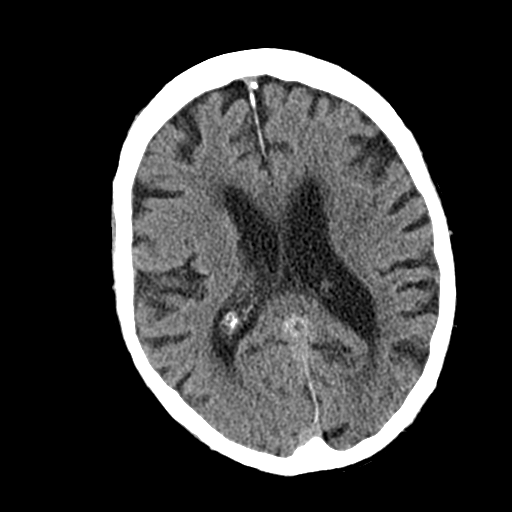
[im 17/33  bone]
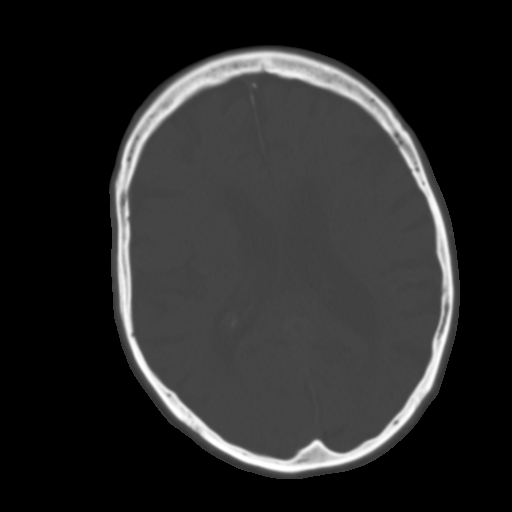
[im 20/33  brain]
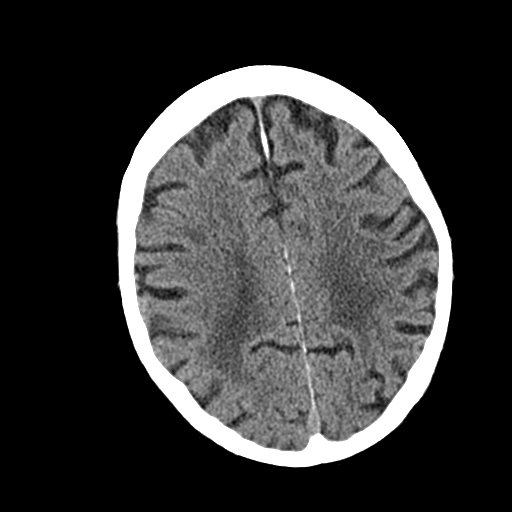
[im 24/33  brain]
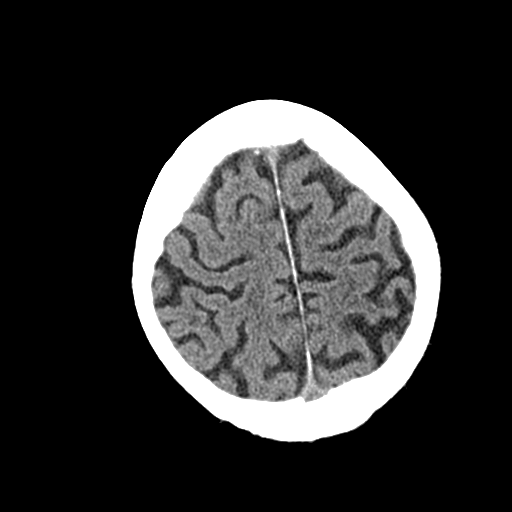
[im 27/33  brain]
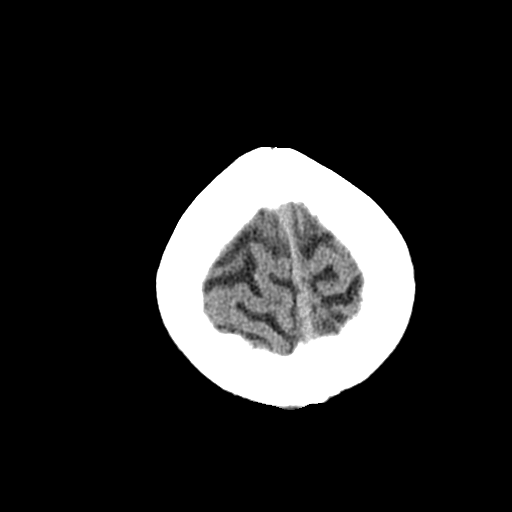
[im 30/33  brain]
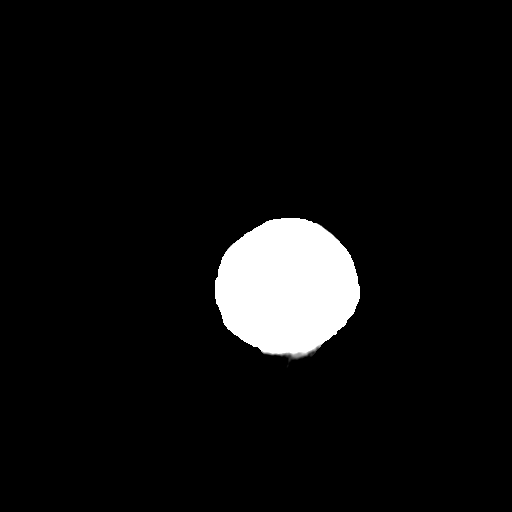
[im 30/33  bone]
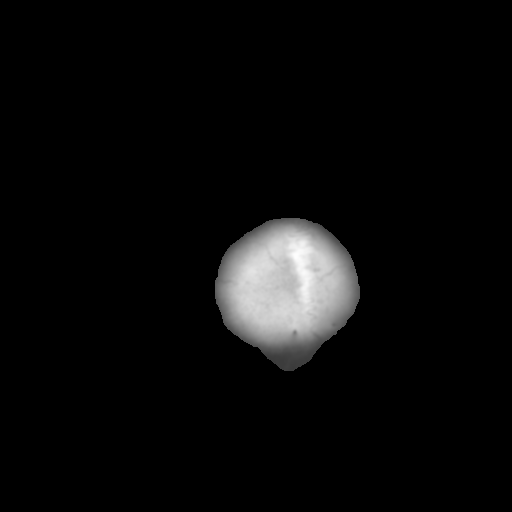

[Series 4: cor soft · coronal · 0.32mm/px · 3 of 75 slices shown]
[im 25/75  brain]
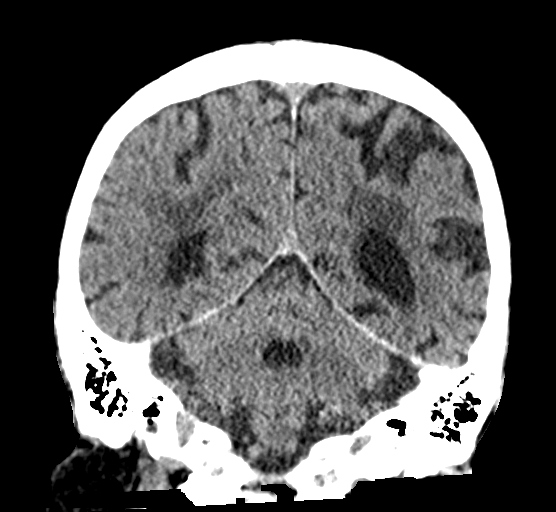
[im 33/75  brain]
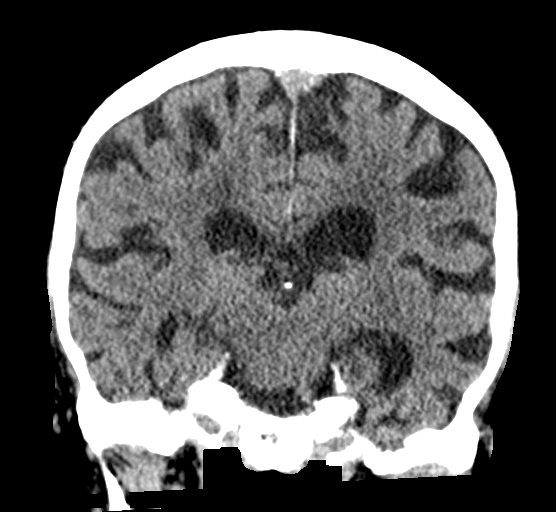
[im 42/75  brain]
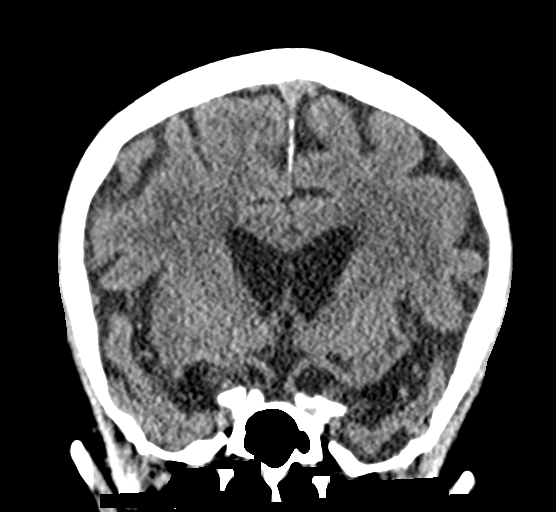

[Series 5: sag soft · sagittal · 0.32mm/px · 3 of 60 slices shown]
[im 20/60  brain]
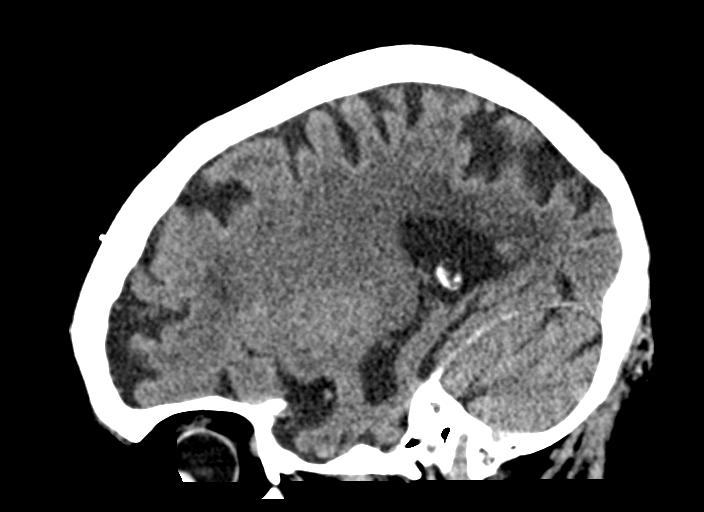
[im 30/60  brain]
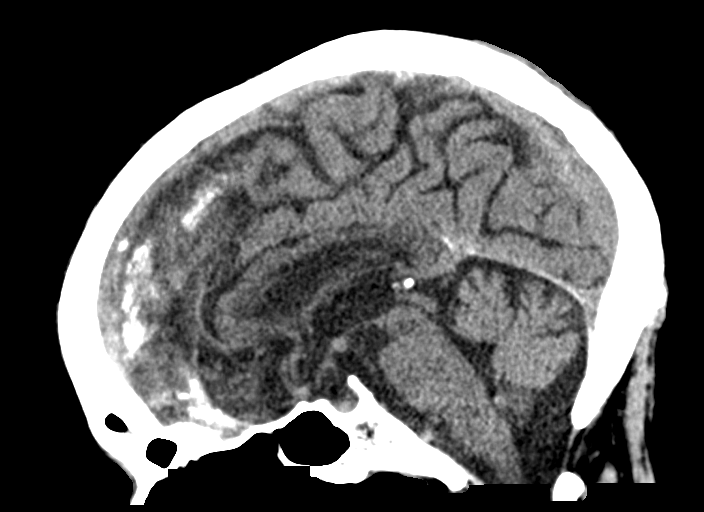
[im 40/60  brain]
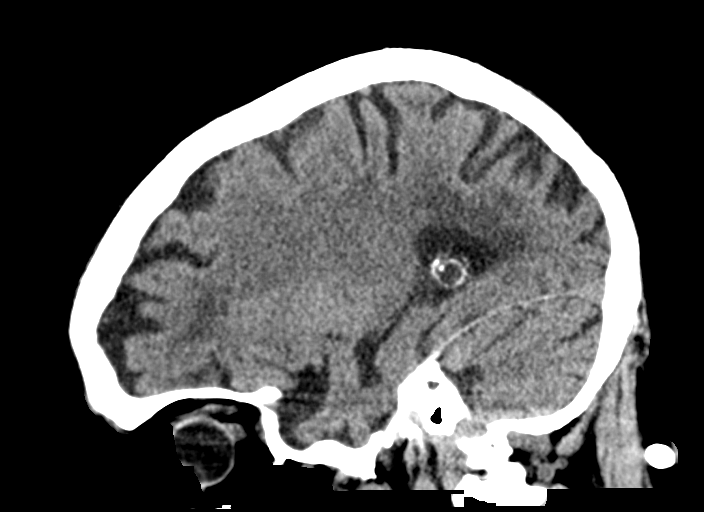

[15 of 47 positions shown; findings below may reference images not displayed]

FINDINGS: Brain: Stable advanced cortical atrophy and small vessel disease in
the periventricular white matter. The brain demonstrates no evidence
of acute hemorrhage, infarction, edema, mass effect, extra-axial
fluid collection, hydrocephalus or mass lesion.

Vascular: No hyperdense vessel or unexpected calcification.

Skull: Normal. Negative for fracture or focal lesion.

Sinuses/Orbits: No acute finding.

Other: Focal scalp soft tissue swelling is present overlying the
posterior vertex. No scalp foreign body identified.
IMPRESSION: No acute findings.  Stable atrophy and small vessel disease.
